# Patient Record
Sex: Male | Born: 1980 | Hispanic: Yes | Marital: Married | Smoking: Former smoker
Health system: Southern US, Community
[De-identification: ages and names within clinical notes are randomized; demographics above are authoritative.]

## PROBLEM LIST (undated history)

## (undated) DIAGNOSIS — Z972 Presence of dental prosthetic device (complete) (partial): Secondary | ICD-10-CM

## (undated) HISTORY — PX: MULTIPLE TOOTH EXTRACTIONS: SHX2053

---

## 2014-08-18 ENCOUNTER — Emergency Department (HOSPITAL_COMMUNITY)
Admission: EM | Admit: 2014-08-18 | Discharge: 2014-08-18 | Disposition: A | Discharge status: Home / Self Care | Payer: Self-pay | Attending: Emergency Medicine | Admitting: Emergency Medicine

## 2014-08-18 ENCOUNTER — Encounter (HOSPITAL_COMMUNITY): Payer: Self-pay | Admitting: Emergency Medicine

## 2014-08-18 DIAGNOSIS — L259 Unspecified contact dermatitis, unspecified cause: Secondary | ICD-10-CM | POA: Insufficient documentation

## 2014-08-18 MED ORDER — PREDNISONE 20 MG PO TABS
60.0000 mg | ORAL_TABLET | Freq: Once | ORAL | Status: AC
  Administered 2014-08-18: 60 mg via ORAL
  Filled 2014-08-18: qty 3

## 2014-08-18 MED ORDER — DIPHENHYDRAMINE HCL 25 MG PO TABS: 25.0000 mg | ORAL_TABLET | Freq: Four times a day (QID) | ORAL | Status: DC | PRN

## 2014-08-18 MED ORDER — PREDNISONE 50 MG PO TABS
ORAL_TABLET | ORAL | Status: DC
Start: 2014-08-18 — End: 2022-08-04

## 2014-08-18 MED ORDER — DIPHENHYDRAMINE HCL 25 MG PO CAPS
25.0000 mg | ORAL_CAPSULE | Freq: Once | ORAL | Status: AC
  Administered 2014-08-18: 25 mg via ORAL
  Filled 2014-08-18: qty 1

## 2014-08-18 NOTE — ED Provider Notes (Signed)
CSN: [id number]     Arrival date & time 08/18/14  1240 History  This chart was scribed for Trixie DredgeEmily Jordan Caraveo, PA-C, working with Gwyneth SproutWhitney Plunkett, MD by Chestine SporeSoijett Blue, ED Scribe. The patient was seen in room TR09C/TR09C at 1:50 PM.     Chief Complaint  Patient presents with  . Rash    The history is provided by the patient. No language interpreter was used.   Donald [name] is a 33 y.o. male who presents today complaining of rash onset 2 weeks. He states that he has poison ivy. He states that he has been itching on both arms and his torso. He states that he was cleaning the house and came in contact with it. He states that he is having associated symptoms of itching. He states that he has tried Benadryl cream with no relief for his symptoms. He denies any other symptoms.  Denies any discharge from the rash.  Denies itching or swelling of the mouth or throat, difficulty swallowing or breathing.      History reviewed. No pertinent past medical history. History reviewed. No pertinent past surgical history. History reviewed. No pertinent family history. History  Substance Use Topics  . Smoking status: Not on file  . Smokeless tobacco: Not on file  . Alcohol Use: No    Review of Systems  Constitutional: Negative for fever.  HENT: Negative for sore throat and trouble swallowing.   Respiratory: Negative for shortness of breath.   Skin: Positive for rash (both arms and torso).  Allergic/Immunologic: Negative for immunocompromised state.  All other systems reviewed and are negative.     Allergies  Review of patient's allergies indicates no known allergies.  Home Medications   Prior to Admission medications   Not on File   BP 123/64  Pulse 55  Temp(Src) 97.4 F (36.3 C) (Oral)  Resp 16  Ht 5\' 8"  (1.727 m)  Wt 250 lb (113.399 kg)  BMI 38.02 kg/m2  SpO2 99%  Physical Exam  Nursing note and vitals reviewed. Constitutional: He appears well-developed and well-nourished. No distress.   HENT:  Head: Normocephalic and atraumatic.  Mouth/Throat: Oropharynx is clear and moist. No oropharyngeal exudate.  Neck: Neck supple.  Pulmonary/Chest: Effort normal.  Neurological: He is alert.  Skin: Rash noted. He is not diaphoretic.  Scattered papules diffusely and occasionally in linear arrangement over bilateral upper extremities, not in the intertriginous area. Not on the palms. Few lesions over the right ear and the left abdominal wall.    ED Course  Procedures (including critical care time) DIAGNOSTIC STUDIES: Oxygen Saturation is 99% on room air, normal by my interpretation.    COORDINATION OF CARE: 1:53 PM-Discussed treatment plan which includes Prednisone and Benadryl with pt at bedside and pt agreed to plan.   Labs Review Labs Reviewed - No data to display  Imaging Review No results found.   EKG Interpretation None      MDM   Final diagnoses:  Contact dermatitis    Afebrile, nontoxic patient with pruritic rash x 2 weeks after contact with poison ivy.  No airway concerns.   D/C home with prednisone, benadryl, PCP follow up PRN.  Discussed result, findings, treatment, and follow up  with patient.  Pt given return precautions.  Pt verbalizes understanding and agrees with plan.       I personally performed the services described in this documentation, which was scribed in my presence. The recorded information has been reviewed and is accurate.    Irving BurtonEmily  GilbertWest, PA-C 08/18/14 1413

## 2014-08-18 NOTE — ED Notes (Signed)
Pt reports having poison ivy. Has rash and itching to both arms and torso x 2 weeks.

## 2014-08-18 NOTE — ED Provider Notes (Signed)
Medical screening examination/treatment/procedure(s) were performed by non-physician practitioner and as supervising physician I was immediately available for consultation/collaboration.   EKG Interpretation None        Armand Preast, MD 08/18/14 1604 

## 2014-08-18 NOTE — Discharge Instructions (Signed)
Read the information below.  Use the prescribed medication as directed.  Please discuss all new medications with your pharmacist.  You may return to the Emergency Department at any time for worsening condition or any new symptoms that concern you.  If you develop redness, swelling, pus draining from the wound, or fevers greater than 100.4, return to the ER immediately for a recheck.      Contact Dermatitis Contact dermatitis is a reaction to certain substances that touch the skin. Contact dermatitis can be either irritant contact dermatitis or allergic contact dermatitis. Irritant contact dermatitis does not require previous exposure to the substance for a reaction to occur.Allergic contact dermatitis only occurs if you have been exposed to the substance before. Upon a repeat exposure, your body reacts to the substance.  CAUSES  Many substances can cause contact dermatitis. Irritant dermatitis is most commonly caused by repeated exposure to mildly irritating substances, such as:  Makeup.  Soaps.  Detergents.  Bleaches.  Acids.  Metal salts, such as nickel. Allergic contact dermatitis is most commonly caused by exposure to:  Poisonous plants.  Chemicals (deodorants, shampoos).  Jewelry.  Latex.  Neomycin in triple antibiotic cream.  Preservatives in products, including clothing. SYMPTOMS  The area of skin that is exposed may develop:  Dryness or flaking.  Redness.  Cracks.  Itching.  Pain or a burning sensation.  Blisters. With allergic contact dermatitis, there may also be swelling in areas such as the eyelids, mouth, or genitals.  DIAGNOSIS  Your caregiver can usually tell what the problem is by doing a physical exam. In cases where the cause is uncertain and an allergic contact dermatitis is suspected, a patch skin test may be performed to help determine the cause of your dermatitis. TREATMENT Treatment includes protecting the skin from further contact with the  irritating substance by avoiding that substance if possible. Barrier creams, powders, and gloves may be helpful. Your caregiver may also recommend:  Steroid creams or ointments applied 2 times daily. For best results, soak the rash area in cool water for 20 minutes. Then apply the medicine. Cover the area with a plastic wrap. You can store the steroid cream in the refrigerator for a "chilly" effect on your rash. That may decrease itching. Oral steroid medicines may be needed in more severe cases.  Antibiotics or antibacterial ointments if a skin infection is present.  Antihistamine lotion or an antihistamine taken by mouth to ease itching.  Lubricants to keep moisture in your skin.  Burow's solution to reduce redness and soreness or to dry a weeping rash. Mix one packet or tablet of solution in 2 cups cool water. Dip a clean washcloth in the mixture, wring it out a bit, and put it on the affected area. Leave the cloth in place for 30 minutes. Do this as often as possible throughout the day.  Taking several cornstarch or baking soda baths daily if the area is too large to cover with a washcloth. Harsh chemicals, such as alkalis or acids, can cause skin damage that is like a burn. You should flush your skin for 15 to 20 minutes with cold water after such an exposure. You should also seek immediate medical care after exposure. Bandages (dressings), antibiotics, and pain medicine may be needed for severely irritated skin.  HOME CARE INSTRUCTIONS  Avoid the substance that caused your reaction.  Keep the area of skin that is affected away from hot water, soap, sunlight, chemicals, acidic substances, or anything else that would  irritate your skin.  Do not scratch the rash. Scratching may cause the rash to become infected.  You may take cool baths to help stop the itching.  Only take over-the-counter or prescription medicines as directed by your caregiver.  See your caregiver for follow-up care as  directed to make sure your skin is healing properly. SEEK MEDICAL CARE IF:   Your condition is not better after 3 days of treatment.  You seem to be getting worse.  You see signs of infection such as swelling, tenderness, redness, soreness, or warmth in the affected area.  You have any problems related to your medicines. Document Released: 10/15/2000 Document Revised: 01/10/2012 Document Reviewed: 03/23/2011 [facility] Patient Information 2015 [place], [place]. This information is not intended to replace advice given to you by your health care provider. Make sure you discuss any questions you have with your health care provider.

## 2014-08-18 NOTE — ED Notes (Addendum)
Got poison ivy on arms  after cleaning around house 2 weeks ago itching bad some spots dry has been using cream has not helped and has benadryl al;s

## 2019-11-13 ENCOUNTER — Encounter (HOSPITAL_COMMUNITY): Payer: Self-pay | Admitting: Emergency Medicine

## 2019-11-13 ENCOUNTER — Emergency Department (HOSPITAL_COMMUNITY): Payer: Self-pay

## 2019-11-13 ENCOUNTER — Other Ambulatory Visit: Payer: Self-pay

## 2019-11-13 ENCOUNTER — Emergency Department (HOSPITAL_COMMUNITY)
Admission: EM | Admit: 2019-11-13 | Discharge: 2019-11-13 | Disposition: A | Discharge status: Home / Self Care | Payer: Self-pay | Attending: Emergency Medicine | Admitting: Emergency Medicine

## 2019-11-13 DIAGNOSIS — M79604 Pain in right leg: Secondary | ICD-10-CM | POA: Insufficient documentation

## 2019-11-13 DIAGNOSIS — W19XXXA Unspecified fall, initial encounter: Secondary | ICD-10-CM

## 2019-11-13 DIAGNOSIS — M79662 Pain in left lower leg: Secondary | ICD-10-CM | POA: Insufficient documentation

## 2019-11-13 DIAGNOSIS — W1789XA Other fall from one level to another, initial encounter: Secondary | ICD-10-CM | POA: Insufficient documentation

## 2019-11-13 MED ORDER — IBUPROFEN 800 MG PO TABS: 800.0000 mg | ORAL_TABLET | Freq: Three times a day (TID) | ORAL | 0 refills | Status: DC

## 2019-11-13 MED ORDER — CYCLOBENZAPRINE HCL 10 MG PO TABS: 10.0000 mg | ORAL_TABLET | Freq: Every day | ORAL | 0 refills | Status: DC

## 2019-11-13 NOTE — Discharge Instructions (Addendum)
Descanse, aplique hielo en la pierna y levntela con regularidad para disminuir la hinchazn.  Tome su ibuprofeno, segn lo prescrito. Le recetaron Flexeril, que es un relajante muscular. No debe conducir, trabajar, consumir alcohol u operar maquinaria mientras toma [place], ya que puede causarle mucho sueo.   Soportar peso y Radio producer ejercicio segn lo tolere.  Llame al mdico ortopdico, Dr. Linna Caprice, para programar una cita para la evaluacin y el tratamiento continuos lo antes posible.  Regrese al servicio de urgencias o busque atencin mdica de inmediato si presenta una disminucin del rango de movimiento, empeoramiento del entumecimiento, pie fro y falta de pulso, incapacidad para flexionar o extender las articulaciones o cualquier otro sntoma nuevo o que empeore.

## 2019-11-13 NOTE — ED Provider Notes (Signed)
[facility] EMERGENC[facility] DEPARTMENT Provider Note   CSN: [id number] Arrival date & time: 11/13/19  1506     History No chief complaint on file.   Donald [name] is a 39 y.o. male with PMH significant for lower left leg surgery with IM nail placement performed approximately 10 years ago subsequent to [facility] who presents the ED with 8 out of 10 lower left leg pain.  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  He reports that since then he has had significant difficulty ambulating due to his pain discomfort.  He reports having diminished sensation and ROM and his lower left leg subsequent to his injury approximately 10 years ago, but states that his inability to ambulate is a new problem.  He denies any other injury.  His left lower leg discomfort is mainly in the area of his calf.    HPI     History reviewed. No pertinent past medical history.  There are no problems to display for this patient.   History reviewed. No pertinent surgical history.     No family history on file.  Social History   Tobacco Use  . Smoking status: Not on file  Substance Use Topics  . Alcohol use: No  . Drug use: No    Home Medications Prior to Admission medications   Medication Sig Start Date End Date Taking? Authorizing Provider  diphenhydrAMINE (BENADRYL) 25 MG tablet Take 1 tablet (25 mg total) by mouth every 6 (six) hours as needed for itching (or rash). 08/18/14   Clayton Bibles, PA-C  predniSONE (DELTASONE) 50 MG tablet Take 1 tablet PO daily x 7 days.  Start 08/19/14 08/18/14   Clayton Bibles, PA-C    Allergies    Patient has no known allergies.  Review of Systems   Review of Systems  Constitutional: Negative for fever.  Musculoskeletal: Positive for arthralgias and gait problem.  Skin: Negative for wound.    Physical Exam Updated Vital  Signs BP 107/68 (BP Location: Left Arm)   Pulse 85   Temp 98.8 F (37.1 C) (Oral)   Resp 16   SpO2 94%   Physical Exam Vitals and nursing note reviewed. Exam conducted with a chaperone present.  Constitutional:      Appearance: Normal appearance.  HENT:     Head: Normocephalic and atraumatic.  Eyes:     General: No scleral icterus.    Conjunctiva/sclera: Conjunctivae normal.  Pulmonary:     Effort: Pulmonary effort is normal.  Musculoskeletal:     Comments: Significant deformity of left lower leg due to history of surgery with IM nail placement.  No new deformity.  Sensation intact distally, albeit limited.  Pulses intact.  Warm extremity.  No significant swelling or erythema.  TTP in area of left calf, none over anterior tibiotalar region.  Negative Thompson's test.  Achilles tendon nontender to palpation.  Left knee demonstrates full ROM and strength intact.  Left ankle demonstrates diminished ability to dorsiflex.   Skin:    General: Skin is dry.     Comments: No open wounds.  No overlying erythema or other skin changes.  Neurological:     Mental Status: He is alert.     GCS: GCS eye subscore is 4. GCS verbal subscore is 5. GCS motor subscore is 6.  Psychiatric:  Mood and Affect: Mood normal.        Behavior: Behavior normal.        Thought Content: Thought content normal.     ED Results / Procedures / Treatments   Labs (all labs ordered are listed, but only abnormal results are displayed) Labs Reviewed - No data to display  EKG None  Radiology DG Tibia/Fibula Left  Result Date: 11/13/2019 CLINICAL DATA:  Larey Seat through flooring in a house under construction EXAM: LEFT TIBIA AND FIBULA - 2 VIEW COMPARISON:  None. FINDINGS: Tibial intramedullary nail is noted secured proximally and distally with threaded fixation screws. Rod traverses a remote healed distal diaphyseal fracture deformity of the tibia. Additional healed mid diaphyseal deformity of the fibula is  present. Metallic staples noted along the medial aspect of the lower leg. No acute fracture or traumatic malalignment is evident. Degenerative changes at the knee and ankle. IMPRESSION: 1. No acute fracture or traumatic malalignment. 2. Healed tibial and fibular fractures with prior tibial intramedullary nail placement. No acute hardware complication. 3. Metallic staples along the medial aspect of the lower leg. Electronically Signed   By: Kreg Shropshire M.D.   On: 11/13/2019 16:26   DG Tibia/Fibula Right  Result Date: 11/13/2019 CLINICAL DATA:  Larey Seat through flooring in a house under construction EXAM: RIGHT TIBIA AND FIBULA - 2 VIEW COMPARISON:  None. FINDINGS: Degenerative changes at the knee and ankle with periarticular spurring. Question some irregular lucency through anterior spurs of the distal tibia but without significant adjacent soft tissue swelling. No other sites concerning for fracture. No traumatic malalignment. Soft tissue gas or foreign body. Mild plantar calcaneal spur is noted IMPRESSION: Irregular lucency through anterior spurs of the distal tibia but without significant adjacent soft tissue swelling, possibly degenerative though could reflect a fragmented osteophyte. Correlate for point tenderness along the anterior tibiotalar joint line. No other sites concerning for acute osseous injury Electronically Signed   By: Kreg Shropshire M.D.   On: 11/13/2019 16:29   DG Knee Complete 4 Views Left  Result Date: 11/13/2019 CLINICAL DATA:  Larey Seat through flooring in a house under construction EXAM: LEFT KNEE - COMPLETE 4+ VIEW COMPARISON:  None FINDINGS: Intramedullary rods of the femur and tibia are present without acute complication. No acute bony abnormality. Specifically, no fracture, subluxation, or dislocation. Tricompartmental degenerative changes of the knee. No visible effusion or significant soft tissue swelling. IMPRESSION: 1. No acute osseous abnormality. 2. Intramedullary rods of the femur  and tibia without acute complication. Electronically Signed   By: Kreg Shropshire M.D.   On: 11/13/2019 16:23   DG Knee Complete 4 Views Right  Result Date: 11/13/2019 CLINICAL DATA:  Larey Seat through flooring in a house under construction EXAM: RIGHT KNEE - COMPLETE 4+ VIEW COMPARISON:  None. FINDINGS: No acute bony abnormality. Specifically, no fracture, subluxation, or dislocation. No sizable effusion. Mild soft tissue swelling anteriorly. IMPRESSION: Mild soft tissue swelling anteriorly. No acute osseous abnormality. Electronically Signed   By: Kreg Shropshire M.D.   On: 11/13/2019 16:24    Procedures Procedures (including critical care time)  Medications Ordered in ED Medications - No data to display  ED Course  I have reviewed the triage vital signs and the nursing notes.  Pertinent labs & imaging results that were available during my care of the patient were reviewed by me and considered in my medical decision making (see chart for details).    MDM Rules/Calculators/A&P  Patient sustained a fall while at work today.  He was approximately 4 feet and he reports that he landed all of his weight onto his left foot.  He did not fall to the ground and he denies any head injury or other bodily injury.  He reports antalgic gait and pain with weightbearing, but denies any new deformity, diminished range of motion, new numbness, or other new neurologic findings.  Suspect possible injury to gastrocnemius muscle.  Plain films obtained of left leg demonstrate no traumatic malalignment or areas concerning for an acute fracture or dislocation.  Possible fragmented osteophyte and tibiotalar region, but no TTP in that area.  Patient was able to demonstrate ability to stand, but weightbearing on left leg was limited due to his discomfort.  His tenderness to palpation was not excruciating and is difficult to assess swelling given his lower leg deformity secondary to surgery.  While he states  that it is more swollen than usual, he denies any new deformities.  Low suspicion for compartment syndrome or an Achilles tendon rupture.  Given lack of new ROM limitations, new numbness or weakness, and relatively reassuring plain films, will refer patient to orthopedics for ongoing evaluation management.  Will prescribe ibuprofen 800 mg 3 times daily as well as a short course of Flexeril to take at night for his calf discomfort.  Encouraged him to ice, as needed, as well as elevate the leg.  He is in no acute distress and is safe for discharge at this time.  He voiced understanding and is agreeable to the plan. All of the evaluation and work-up results were discussed with the patient and any family at bedside. They were provided opportunity to ask any additional questions and have none at this time. They have expressed understanding of verbal discharge instructions as well as return precautions and are agreeable to the plan.   Translator was used for history, assessment, and plan.   Final Clinical Impression(s) / ED Diagnoses Final diagnoses:  Fall, initial encounter    Rx / DC Orders ED Discharge Orders    None       Elvera Maria 11/13/19 1804    Tegeler, Canary Brim, MD 11/13/19 [phone]

## 2019-11-13 NOTE — ED Triage Notes (Signed)
Pt was working on a house when he walked across some flooring and a piece of wood moved, pt fell through the crack injuring bilateral legs, denies hitting head or other injuries, skin abrasions and mild swelling noted, reports bilateral shin pain.

## 2020-09-14 IMAGING — DX DG TIBIA/FIBULA 2V*L*
5 series · 5 of 5 positions shown · non-contrast
Comparison: None.

CLINICAL DATA: Fell through flooring in a house under construction

EXAM:
LEFT TIBIA AND FIBULA - 2 VIEW

[tibia ap (1 of 2)]
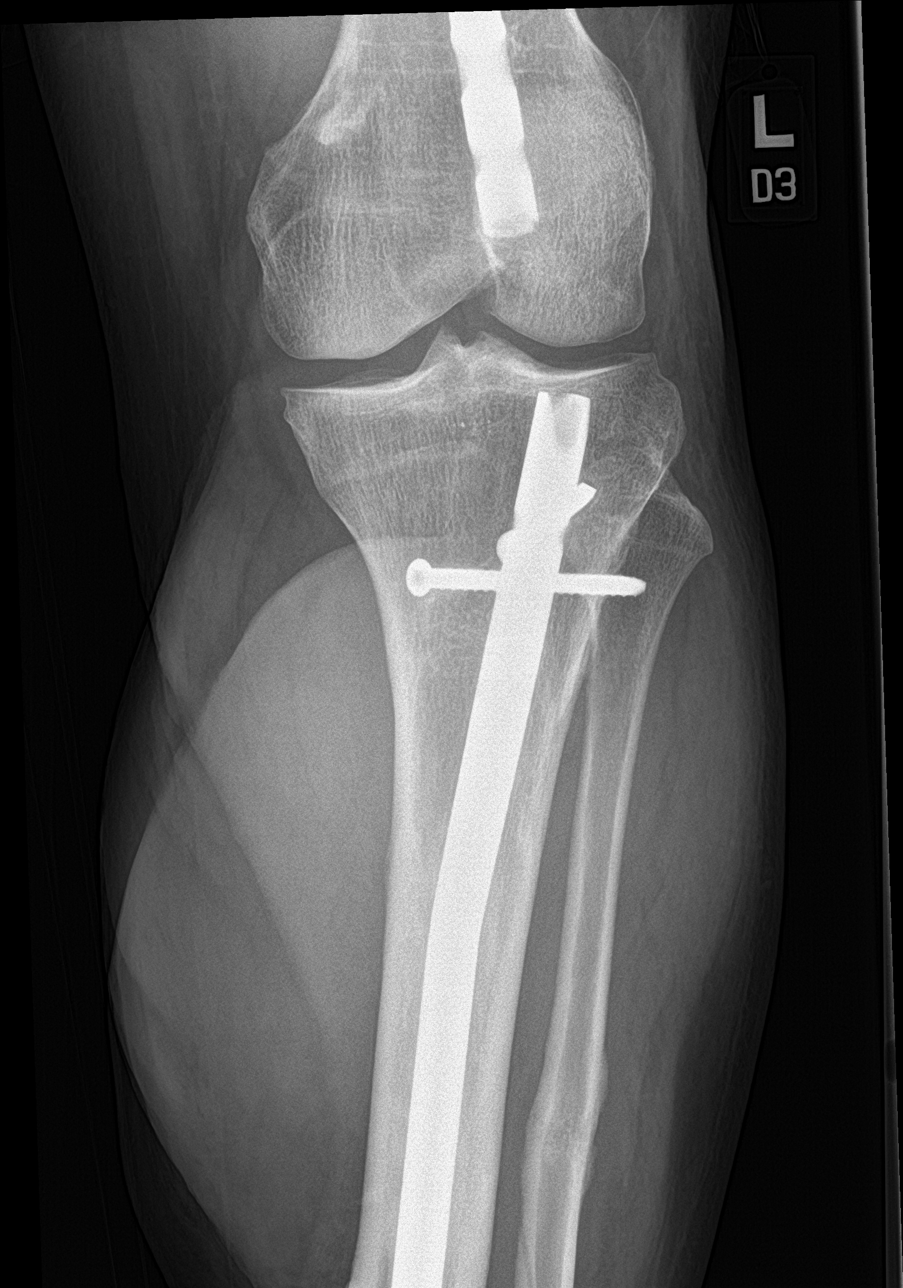

[tibia ap (2 of 2)]
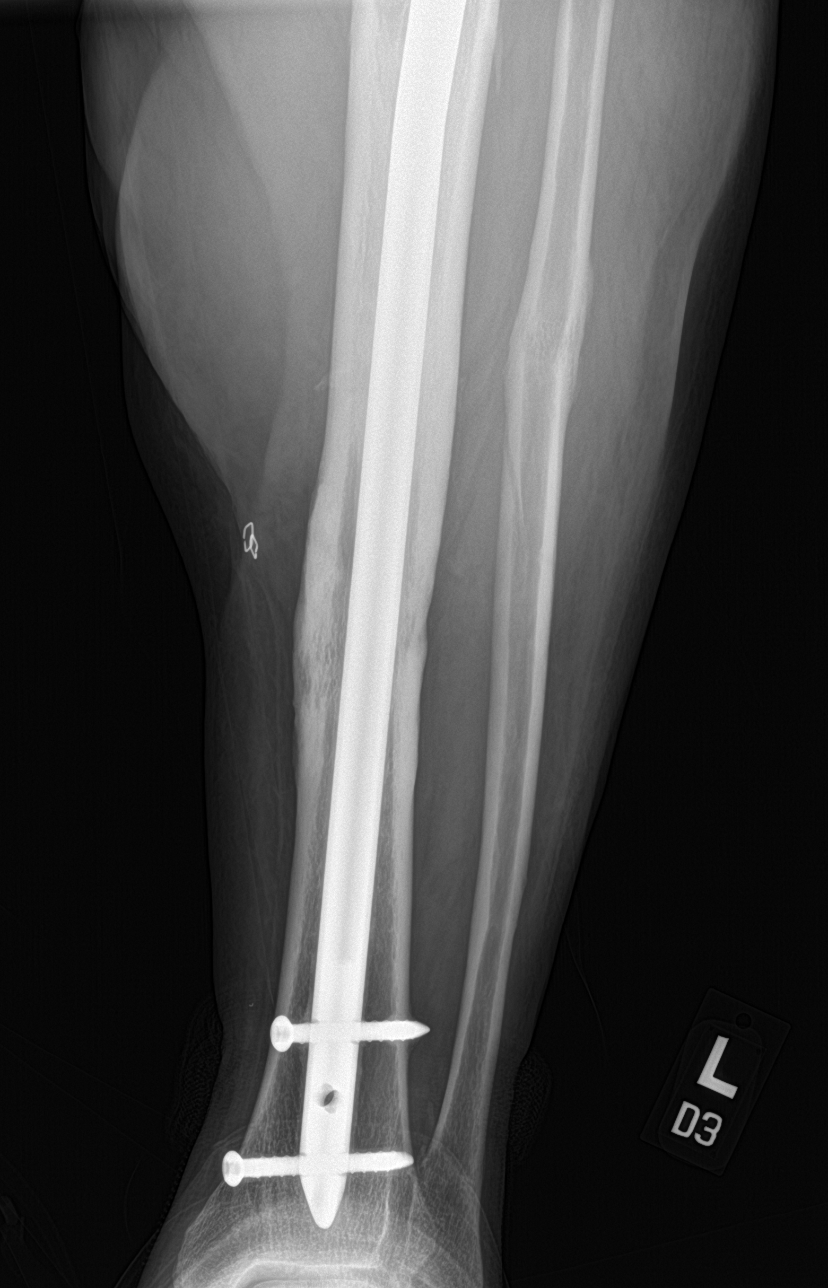

[tibia lat (1 of 3)]
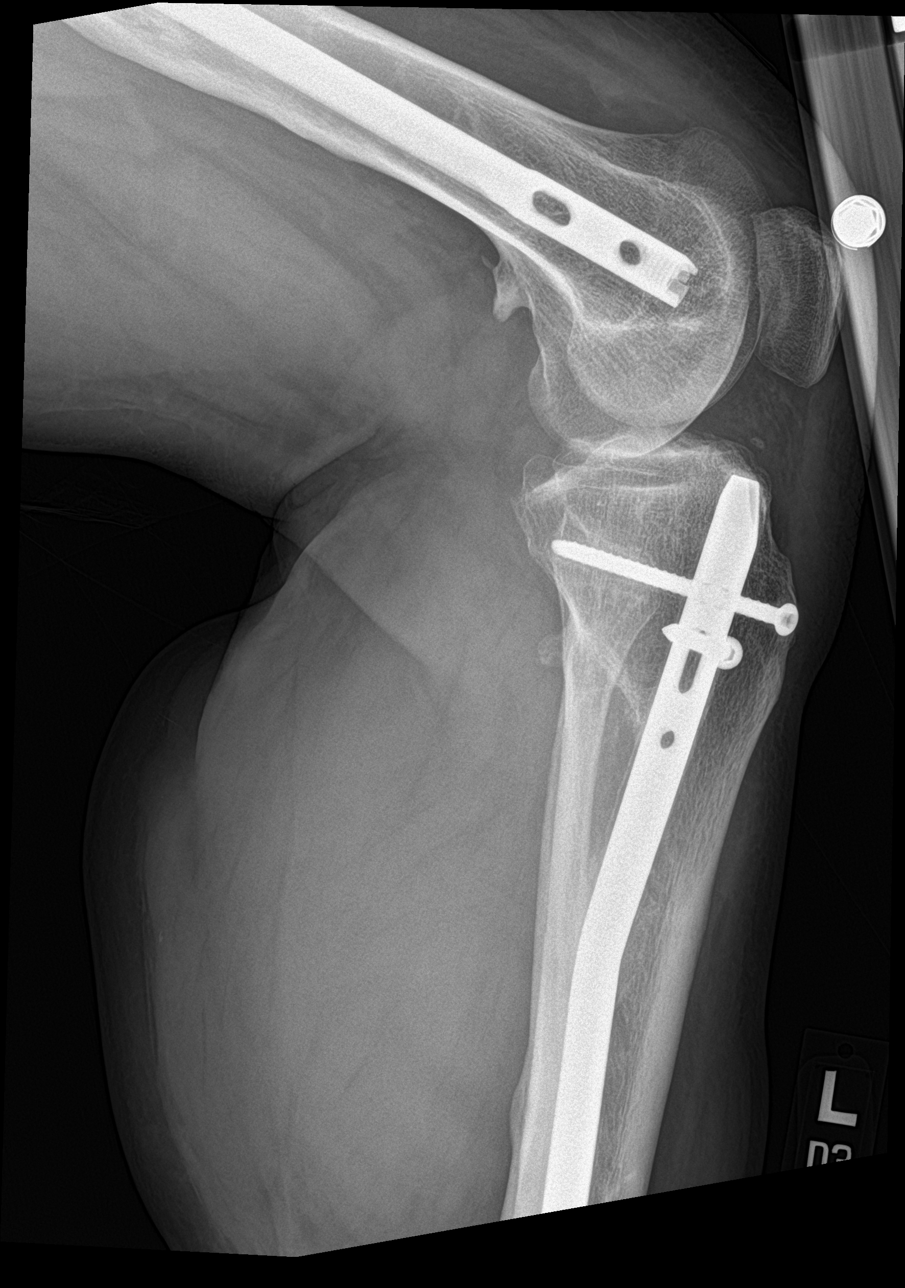

[tibia lat (2 of 3)]
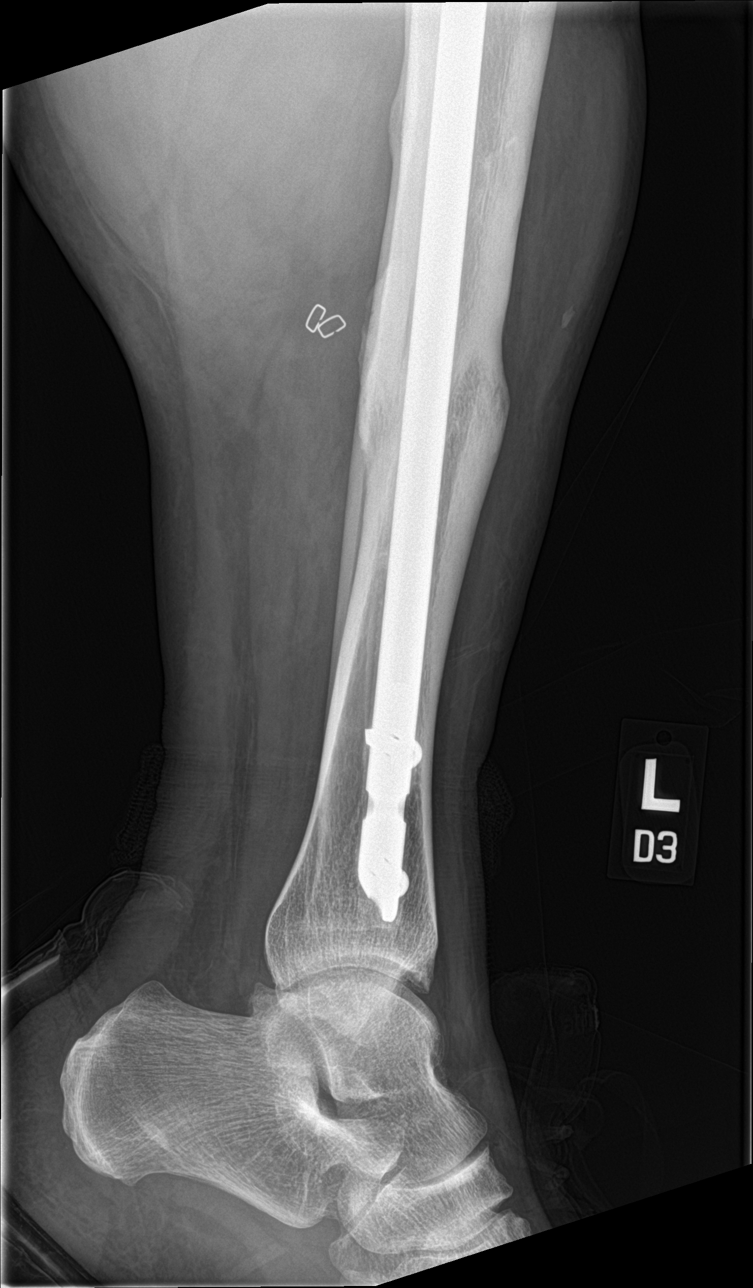

[tibia lat (3 of 3)]
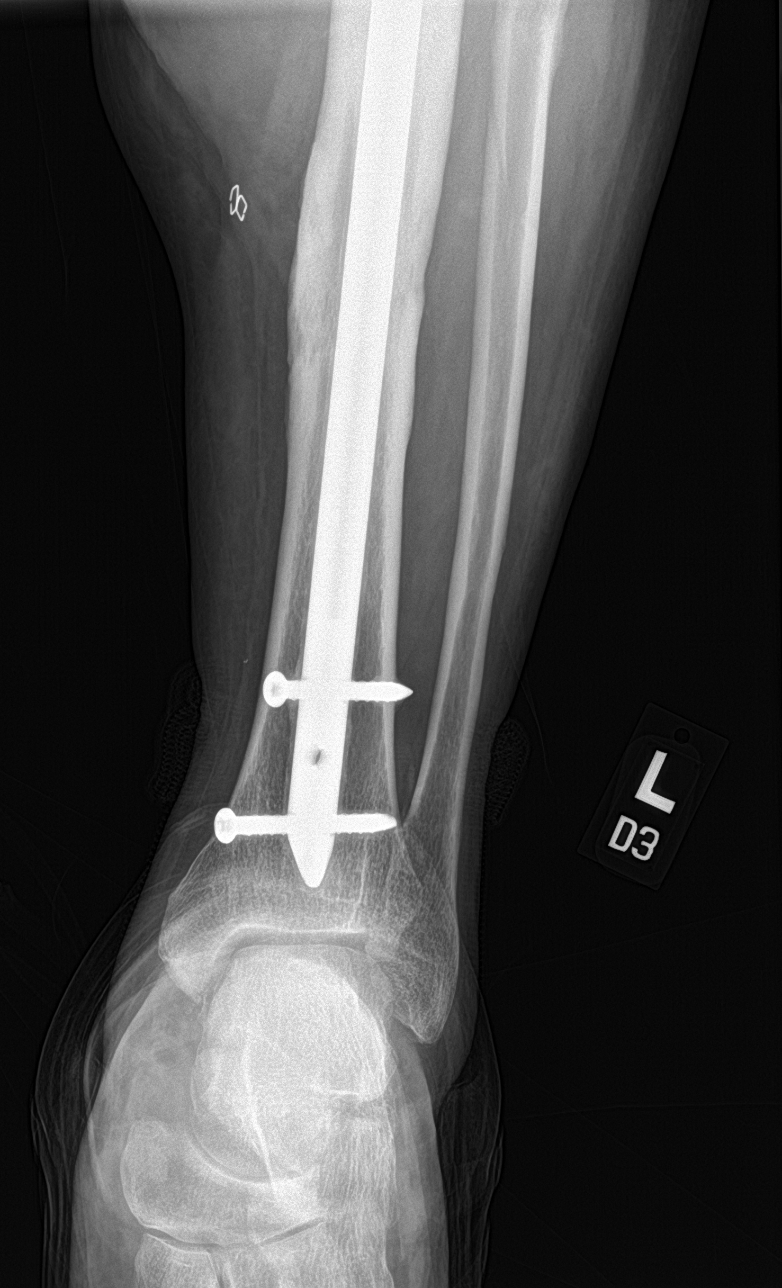

[5 of 5 positions shown; findings below may reference images not displayed]

FINDINGS: Tibial intramedullary nail is noted secured proximally and distally
with threaded fixation screws. Rod traverses a remote healed distal
diaphyseal fracture deformity of the tibia. Additional healed mid
diaphyseal deformity of the fibula is present. Metallic staples
noted along the medial aspect of the lower leg. No acute fracture or
traumatic malalignment is evident. Degenerative changes at the knee
and ankle.
IMPRESSION: 1. No acute fracture or traumatic malalignment.
2. Healed tibial and fibular fractures with prior tibial
intramedullary nail placement. No acute hardware complication.
3. Metallic staples along the medial aspect of the lower leg.

## 2020-12-10 ENCOUNTER — Encounter (HOSPITAL_COMMUNITY): Payer: Self-pay | Admitting: *Deleted

## 2020-12-10 ENCOUNTER — Ambulatory Visit (HOSPITAL_COMMUNITY)
Admission: EM | Admit: 2020-12-10 | Discharge: 2020-12-10 | Disposition: A | Discharge status: Home / Self Care | Payer: Self-pay | Attending: Family Medicine | Admitting: Family Medicine

## 2020-12-10 ENCOUNTER — Other Ambulatory Visit: Payer: Self-pay

## 2020-12-10 DIAGNOSIS — N390 Urinary tract infection, site not specified: Secondary | ICD-10-CM | POA: Insufficient documentation

## 2020-12-10 LAB — POCT URINALYSIS DIPSTICK, ED / UC
Bilirubin Urine: NEGATIVE
Glucose, UA: NEGATIVE mg/dL
Ketones, ur: NEGATIVE mg/dL
Nitrite: NEGATIVE
Protein, ur: 30 mg/dL — AB
Specific Gravity, Urine: 1.01 (ref 1.005–1.030)
Urobilinogen, UA: 0.2 mg/dL (ref 0.0–1.0)
pH: 6.5 (ref 5.0–8.0)

## 2020-12-10 MED ORDER — ACETAMINOPHEN 325 MG PO TABS
975.0000 mg | ORAL_TABLET | Freq: Once | ORAL | Status: AC
  Administered 2020-12-10: 975 mg via ORAL

## 2020-12-10 MED ORDER — ACETAMINOPHEN 325 MG PO TABS
ORAL_TABLET | ORAL | Status: AC
  Filled 2020-12-10: qty 3

## 2020-12-10 MED ORDER — CEPHALEXIN 500 MG PO CAPS: 500.0000 mg | ORAL_CAPSULE | Freq: Four times a day (QID) | ORAL | 0 refills | Status: DC

## 2020-12-10 MED ORDER — SULFAMETHOXAZOLE-TRIMETHOPRIM 800-160 MG PO TABS: 1.0000 | ORAL_TABLET | Freq: Two times a day (BID) | ORAL | 0 refills | Status: AC

## 2020-12-10 NOTE — ED Triage Notes (Signed)
Pt reports Sx's started last night with Frequency,dysuriaand lower back pain . Pt reports he has to pee evert 14 sec. Marland Kitchen

## 2020-12-10 NOTE — ED Provider Notes (Signed)
[facility]    CSN: [id number] Arrival date & time: 12/10/20  1621      History   Chief Complaint Chief Complaint  Patient presents with  . Dysuria  . Urinary Frequency  . Back Pain    HPI Donald [name] is a 40 y.o. male.   Griff Badley presents with complaints of urinary frequency, low back pain and chills which started mildly yesterday and worsened today. Noted blood to his urine earlier today. This has cleared up since increasing his water intake. No nausea or vomiting. No flank pain. Not necessarily painful with urination. No rectal pain or pelvic pain. Sexually active with his wife, denies any concerns for std's. No penile discharge. Denies any penile or scrotum redness, swelling sores or lesions. Denies any previous similar. He works in [employer], physical labor   ROS per HPI, negative if not otherwise mentioned.      History reviewed. No pertinent past medical history.  There are no problems to display for this patient.   History reviewed. No pertinent surgical history.     Home Medications    Prior to Admission medications   Medication Sig Start Date End Date Taking? Authorizing Provider  sulfamethoxazole-trimethoprim (BACTRIM DS) 800-160 MG tablet Take 1 tablet by mouth 2 (two) times daily for 7 days. 12/10/20 12/17/20 Yes Mechelle Pates, Barron Alvine, NP  cyclobenzaprine (FLEXERIL) 10 MG tablet Take 1 tablet (10 mg total) by mouth at bedtime. 11/13/19   Lorelee New, PA-C  diphenhydrAMINE (BENADRYL) 25 MG tablet Take 1 tablet (25 mg total) by mouth every 6 (six) hours as needed for itching (or rash). 08/18/14   Trixie Dredge, PA-C  ibuprofen (ADVIL) 800 MG tablet Take 1 tablet (800 mg total) by mouth 3 (three) times daily. 11/13/19   Lorelee New, PA-C  predniSONE (DELTASONE) 50 MG tablet Take 1 tablet PO daily x 7 days.  Start 08/19/14 08/18/14   Trixie Dredge, PA-C    Family History History reviewed. No pertinent family history.  Social History Social History    Tobacco Use  . Smoking status: Former Smoker    Quit date: 09/09/2020    Years since quitting: 0.2  . Smokeless tobacco: Never Used  Substance Use Topics  . Alcohol use: No  . Drug use: No     Allergies   Patient has no known allergies.   Review of Systems Review of Systems   Physical Exam Triage Vital Signs ED Triage Vitals  Enc Vitals Group     BP 12/10/20 1638 125/63     Pulse Rate 12/10/20 1638 (!) 110     Resp 12/10/20 1638 20     Temp 12/10/20 1638 (!) 101 F (38.3 C)     Temp Source 12/10/20 1638 Oral     SpO2 12/10/20 1638 98 %     Weight --      Height --      Head Circumference --      Peak Flow --      Pain Score 12/10/20 1641 10     Pain Loc --      Pain Edu? --      Excl. in GC? --    No data found.  Updated Vital Signs BP 125/63 (BP Location: Right Arm)   Pulse (!) 110   Temp (!) 101 F (38.3 C) (Oral)   Resp 20   SpO2 98%   Visual Acuity Right Eye Distance:   Left Eye Distance:   Bilateral Distance:  Right Eye Near:   Left Eye Near:    Bilateral Near:     Physical Exam Constitutional:      Appearance: He is well-developed.  Cardiovascular:     Rate and Rhythm: Tachycardia present.  Pulmonary:     Effort: Pulmonary effort is normal.  Abdominal:     Tenderness: There is no abdominal tenderness. There is no right CVA tenderness or left CVA tenderness.  Genitourinary:    Comments: Denies any genitalia redness, swelling, warmth, lesions  Skin:    General: Skin is warm and dry.  Neurological:     Mental Status: He is alert and oriented to person, place, and time.      UC Treatments / Results  Labs (all labs ordered are listed, but only abnormal results are displayed) Labs Reviewed  POCT URINALYSIS DIPSTICK, ED / UC - Abnormal; Notable for the following components:      Result Value   Hgb urine dipstick LARGE (*)    Protein, ur 30 (*)    Leukocytes,Ua MODERATE (*)    All other components within normal limits  URINE  CULTURE    EKG   Radiology No results found.  Procedures Procedures (including critical care time)  Medications Ordered in UC Medications  acetaminophen (TYLENOL) tablet 975 mg (975 mg Oral Given 12/10/20 1726)    Initial Impression / Assessment and Plan / UC Course  I have reviewed the triage vital signs and the nursing notes.  Pertinent labs & imaging results that were available during my care of the patient were reviewed by me and considered in my medical decision making (see chart for details).     Exquisite urinary frequency, no glucose to urine, febrile here with large hgb and leuks to urine. Bactrim initiated with culture pending. Encouraged follow up for recheck, prostatitis considered but seems low risk in this otherwise active 40 year old, low risk sti; no cva pain to indicate nephrolithiasis as source of hgb to urine. Return precautions provided. Patient verbalized understanding and agreeable to plan.   Final Clinical Impressions(s) / UC Diagnoses   Final diagnoses:  Lower urinary tract infectious disease     Discharge Instructions     Complete course of antibiotics.  It appears that you have a urinary tract infection.  Drink plenty of water to empty bladder regularly. Avoid alcohol and caffeine as these may irritate the bladder.   If no improvement or any worsening of symptoms, especially even by this weekend please return for recheck.  Please follow up with a primary care provider as it is unusual for otherwise healthy men to get urinary tract infections.   Curso completo de antibiticos. Parece que tienes una infeccin del tracto urinario. Beba mucha agua para vaciar la vejiga con regularidad. Evite el alcohol y la cafena, ya que pueden irritar la vejiga. Si los sntomas no mejoran o empeoran, especialmente incluso para este fin de semana, regrese para que lo revisen nuevamente. Haga un seguimiento con un proveedor de Marine scientist, ya que es inusual que  los hombres sanos tengan infecciones del tracto urinario.   ED Prescriptions    Medication Sig Dispense Auth. Provider   cephALEXin (KEFLEX) 500 MG capsule  (Status: Discontinued) Take 1 capsule (500 mg total) by mouth 4 (four) times daily for 7 days. 28 capsule Linus Mako B, NP   sulfamethoxazole-trimethoprim (BACTRIM DS) 800-160 MG tablet Take 1 tablet by mouth 2 (two) times daily for 7 days. 14 tablet Georgetta Haber, NP  PDMP not reviewed this encounter.   Georgetta Haber, NP 12/10/20 1736

## 2020-12-10 NOTE — Discharge Instructions (Addendum)
Complete course of antibiotics.  It appears that you have a urinary tract infection.  Drink plenty of water to empty bladder regularly. Avoid alcohol and caffeine as these may irritate the bladder.   If no improvement or any worsening of symptoms, especially even by this weekend please return for recheck.  Please follow up with a primary care provider as it is unusual for otherwise healthy men to get urinary tract infections.   Curso completo de antibiticos. Parece que tienes una infeccin del tracto urinario. Beba mucha agua para vaciar la vejiga con regularidad. Evite el alcohol y la cafena, ya que pueden irritar la vejiga. Si los sntomas no mejoran o empeoran, especialmente incluso para este fin de semana, regrese para que lo revisen nuevamente. Haga un seguimiento con un proveedor de Marine scientist, ya que es inusual que los hombres sanos tengan infecciones del tracto urinario.

## 2020-12-12 LAB — URINE CULTURE: Culture: >=100000 — AB

## 2020-12-13 LAB — URINE CULTURE

## 2020-12-15 ENCOUNTER — Telehealth (HOSPITAL_COMMUNITY): Payer: Self-pay | Admitting: Emergency Medicine

## 2020-12-15 MED ORDER — NITROFURANTOIN MONOHYD MACRO 100 MG PO CAPS
100.0000 mg | ORAL_CAPSULE | Freq: Two times a day (BID) | ORAL | 0 refills | Status: DC
Start: 2020-12-15 — End: 2022-08-04

## 2020-12-23 ENCOUNTER — Telehealth (HOSPITAL_COMMUNITY): Payer: Self-pay | Admitting: Emergency Medicine

## 2020-12-23 NOTE — Telephone Encounter (Signed)
Patient called after receiving results letter in the mail.  Reviewed urine culture, states he will go to pick up antibiotics.  Encouraged him to call the pharmacy in advance, and to let me know if there are any issues

## 2021-04-04 ENCOUNTER — Other Ambulatory Visit: Payer: Self-pay

## 2021-04-04 ENCOUNTER — Encounter (HOSPITAL_COMMUNITY): Payer: Self-pay

## 2021-04-04 ENCOUNTER — Emergency Department (HOSPITAL_COMMUNITY)
Admission: EM | Admit: 2021-04-04 | Discharge: 2021-04-04 | Disposition: A | Discharge status: Home / Self Care | Payer: Self-pay | Attending: Emergency Medicine | Admitting: Emergency Medicine

## 2021-04-04 ENCOUNTER — Emergency Department (HOSPITAL_COMMUNITY): Payer: Self-pay

## 2021-04-04 DIAGNOSIS — N451 Epididymitis: Secondary | ICD-10-CM | POA: Insufficient documentation

## 2021-04-04 DIAGNOSIS — Z87891 Personal history of nicotine dependence: Secondary | ICD-10-CM | POA: Insufficient documentation

## 2021-04-04 DIAGNOSIS — B9689 Other specified bacterial agents as the cause of diseases classified elsewhere: Secondary | ICD-10-CM | POA: Insufficient documentation

## 2021-04-04 LAB — CBC WITH DIFFERENTIAL/PLATELET
Abs Immature Granulocytes: 0.06 10*3/uL (ref 0.00–0.07)
Basophils Absolute: 0 10*3/uL (ref 0.0–0.1)
Basophils Relative: 0 %
Eosinophils Absolute: 0.1 10*3/uL (ref 0.0–0.5)
Eosinophils Relative: 0 %
HCT: 38.8 % — ABNORMAL LOW (ref 39.0–52.0)
Hemoglobin: 13.1 g/dL (ref 13.0–17.0)
Immature Granulocytes: 0 %
Lymphocytes Relative: 14 %
Lymphs Abs: 2 10*3/uL (ref 0.7–4.0)
MCH: 29.6 pg (ref 26.0–34.0)
MCHC: 33.8 g/dL (ref 30.0–36.0)
MCV: 87.6 fL (ref 80.0–100.0)
Monocytes Absolute: 1.3 10*3/uL — ABNORMAL HIGH (ref 0.1–1.0)
Monocytes Relative: 9 %
Neutro Abs: 10.7 10*3/uL — ABNORMAL HIGH (ref 1.7–7.7)
Neutrophils Relative %: 77 %
Platelets: 250 10*3/uL (ref 150–400)
RBC: 4.43 MIL/uL (ref 4.22–5.81)
RDW: 13.7 % (ref 11.5–15.5)
WBC: 14.2 10*3/uL — ABNORMAL HIGH (ref 4.0–10.5)
nRBC: 0 % (ref 0.0–0.2)

## 2021-04-04 LAB — URINALYSIS, ROUTINE W REFLEX MICROSCOPIC
Bacteria, UA: NONE SEEN
Bilirubin Urine: NEGATIVE
Glucose, UA: NEGATIVE mg/dL
Ketones, ur: NEGATIVE mg/dL
Nitrite: NEGATIVE
Protein, ur: NEGATIVE mg/dL
Specific Gravity, Urine: 1.013 (ref 1.005–1.030)
pH: 6 (ref 5.0–8.0)

## 2021-04-04 LAB — BASIC METABOLIC PANEL
Anion gap: 7 (ref 5–15)
BUN: 10 mg/dL (ref 6–20)
CO2: 26 mmol/L (ref 22–32)
Calcium: 8.7 mg/dL — ABNORMAL LOW (ref 8.9–10.3)
Chloride: 103 mmol/L (ref 98–111)
Creatinine, Ser: 0.74 mg/dL (ref 0.61–1.24)
GFR, Estimated: >60 mL/min (ref >60)
Glucose, Bld: 132 mg/dL — ABNORMAL HIGH (ref 70–99)
Potassium: 3.8 mmol/L (ref 3.5–5.1)
Sodium: 136 mmol/L (ref 135–145)

## 2021-04-04 MED ORDER — STERILE WATER FOR INJECTION IJ SOLN
INTRAMUSCULAR | Status: AC
  Administered 2021-04-04: 2.1 mL
  Filled 2021-04-04: qty 10

## 2021-04-04 MED ORDER — LEVOFLOXACIN 500 MG PO TABS: 500.0000 mg | ORAL_TABLET | Freq: Every day | ORAL | 0 refills | Status: DC

## 2021-04-04 MED ORDER — CEFTRIAXONE SODIUM 1 G IJ SOLR
1.0000 g | Freq: Once | INTRAMUSCULAR | Status: AC
  Administered 2021-04-04: 1 g via INTRAMUSCULAR
  Filled 2021-04-04: qty 10

## 2021-04-04 NOTE — ED Notes (Signed)
Reviewed discharge instructions with patient. Follow-up care and medications reviewed. Patient  verbalized understanding. Patient A&Ox4, VSS, and ambulatory with steady gait upon discharge.  °

## 2021-04-04 NOTE — ED Notes (Signed)
Patient transported to Ultrasound 

## 2021-04-04 NOTE — Discharge Instructions (Signed)
Take antibiotics as prescribed and complete the full course. Follow-up with urology, referral given.

## 2021-04-04 NOTE — ED Triage Notes (Signed)
Pt arrived to ED via POV w/ c/o hematuria and R testicle pain since Tuesday. Pt reports that drinking lots of water has helped lessen the blood in his urine. Pt has also been using ice for his testicular pain and reported that he got penicillin at "the [place] store" and has been taking that for infection. Pt rates pain 10/10 and appears in NAD. Pt denies medical hx other than a previous broken foot and denies any other medication use other than the penicillin.   Interpreter used for triage and assessment.

## 2021-04-04 NOTE — ED Provider Notes (Signed)
[facility] EMERGENCY DEPARTMENT Provider Note   CSN: [id number] Arrival date & time: 04/04/21  0734     History Chief Complaint  Patient presents with  . Hematuria    Donald [name] is a 40 y.o. male.  40 year old male with complaint of blood in the urine and right testicle pain x 4 days. Reports ongoing pain in the right testicle with swelling, hematuria resolved yesterday. Pain radiates to RLQ, denies fevers, nausea, vomiting, changes in bowel habits. Similar episode 2 months ago, diagnosed with UTI and treated with antibiotics. Patient has been taking penicillin for his symptoms today.         History reviewed. No pertinent past medical history.  There are no problems to display for this patient.   History reviewed. No pertinent surgical history.     History reviewed. No pertinent family history.  Social History   Tobacco Use  . Smoking status: Former Smoker    Quit date: 09/09/2020    Years since quitting: 0.5  . Smokeless tobacco: Never Used  Substance Use Topics  . Alcohol use: No  . Drug use: No    Home Medications Prior to Admission medications   Medication Sig Start Date End Date Taking? Authorizing Provider  levofloxacin (LEVAQUIN) 500 MG tablet Take 1 tablet (500 mg total) by mouth daily. 04/04/21  Yes Jeannie Fend, PA-C  cyclobenzaprine (FLEXERIL) 10 MG tablet Take 1 tablet (10 mg total) by mouth at bedtime. 11/13/19   Lorelee New, PA-C  diphenhydrAMINE (BENADRYL) 25 MG tablet Take 1 tablet (25 mg total) by mouth every 6 (six) hours as needed for itching (or rash). 08/18/14   Trixie Dredge, PA-C  ibuprofen (ADVIL) 800 MG tablet Take 1 tablet (800 mg total) by mouth 3 (three) times daily. 11/13/19   Lorelee New, PA-C  nitrofurantoin, macrocrystal-monohydrate, (MACROBID) 100 MG capsule Take 1 capsule (100 mg total) by mouth 2 (two) times daily. 12/15/20   Merrilee Jansky, MD  predniSONE (DELTASONE) 50 MG tablet Take 1 tablet PO  daily x 7 days.  Start 08/19/14 08/18/14   Trixie Dredge, PA-C    Allergies    Patient has no known allergies.  Review of Systems   Review of Systems  Constitutional: Negative for fever.  Gastrointestinal: Positive for abdominal pain. Negative for constipation, diarrhea, nausea and vomiting.  Genitourinary: Positive for dysuria, frequency, hematuria, scrotal swelling and testicular pain.  Musculoskeletal: Negative for back pain.  Skin: Negative for rash and wound.  Allergic/Immunologic: Negative for immunocompromised state.  Neurological: Negative for weakness.  Hematological: Negative for adenopathy.  Psychiatric/Behavioral: Negative for confusion.  All other systems reviewed and are negative.   Physical Exam Updated Vital Signs BP 128/67   Pulse 64   Temp 97.6 F (36.4 C) (Temporal)   Resp 18   Ht 5\' 8"  (1.727 m)   Wt 74.8 kg   SpO2 95%   BMI 25.09 kg/m   Physical Exam Vitals and nursing note reviewed.  Constitutional:      General: He is not in acute distress.    Appearance: He is well-developed. He is not diaphoretic.  HENT:     Head: Normocephalic and atraumatic.  Cardiovascular:     Rate and Rhythm: Normal rate and regular rhythm.     Heart sounds: Normal heart sounds.  Pulmonary:     Effort: Pulmonary effort is normal.     Breath sounds: Normal breath sounds.  Abdominal:     Palpations: Abdomen  is soft.     Tenderness: There is no abdominal tenderness. There is no right CVA tenderness or left CVA tenderness.  Genitourinary:    Testes:        Right: Tenderness and swelling present.        Left: Tenderness or swelling not present.  Skin:    General: Skin is warm and dry.     Findings: No erythema or rash.  Neurological:     Mental Status: He is alert and oriented to person, place, and time.  Psychiatric:        Behavior: Behavior normal.     ED Results / Procedures / Treatments   Labs (all labs ordered are listed, but only abnormal results are  displayed) Labs Reviewed  URINALYSIS, ROUTINE W REFLEX MICROSCOPIC - Abnormal; Notable for the following components:      Result Value   Hgb urine dipstick MODERATE (*)    Leukocytes,Ua MODERATE (*)    All other components within normal limits  BASIC METABOLIC PANEL - Abnormal; Notable for the following components:   Glucose, Bld 132 (*)    Calcium 8.7 (*)    All other components within normal limits  CBC WITH DIFFERENTIAL/PLATELET - Abnormal; Notable for the following components:   WBC 14.2 (*)    HCT 38.8 (*)    Neutro Abs 10.7 (*)    Monocytes Absolute 1.3 (*)    All other components within normal limits  URINE CULTURE  GC/CHLAMYDIA PROBE AMP ([place]) NOT AT [facility]    EKG None  Radiology US SCROTUM W/DOPPLER  Result Date: 04/04/2021 CLINICAL DATA:  Right scrotum pain EXAM: SCROTAL ULTRASOUND DOPPLER ULTRASOUND OF THE TESTICLES TECHNIQUE: Complete ultrasound examination of the testicles, epididymis, and other scrotal structures was performed. Color and spectral Doppler ultrasound were also utilized to evaluate blood flow to the testicles. COMPARISON:  None. FINDINGS: Right testicle Measurements: 4.5 x 3.0 x 3.3 cm. No mass or microlithiasis visualized. Left testicle Measurements: 4.6 x 2.7 x 2.8 cm. No mass or microlithiasis visualized. Right epididymis: The right epididymis is diffusely enlarged and edematous. The right epididymis is diffusely hyperemic. No well-defined extratesticular mass on the right. Left epididymis: Normal in size and appearance. No edema or hyperemia. Hydrocele: Small hydrocele on the right. Minimal hydrocele on the left. Varicocele:  None visualized. Pulsed Doppler interrogation of both testes demonstrates normal low resistance arterial and venous waveforms bilaterally. Scrotal wall thickening noted on the right. IMPRESSION: 1. Enlarged, edematous, and hyperemic right epididymis consistent with right-sided epididymitis. No right epididymal mass. 2.  Left  epididymis appears normal. 3. No intratesticular mass on either side. No orchitis or testicular torsion on either side. 4.  Small hydrocele on the right.  Minimal hydrocele on the left. 5. Scrotal wall thickening on the right is probably due to the adjacent epididymitis. Electronically Signed   By: Bretta Bang III M.D.   On: 04/04/2021 09:01    Procedures Procedures   Medications Ordered in ED Medications  cefTRIAXone (ROCEPHIN) injection 1 g (1 g Intramuscular Given 04/04/21 0925)  sterile water (preservative free) injection (2.1 mLs  Given 04/04/21 4008)    ED Course  I have reviewed the triage vital signs and the nursing notes.  Pertinent labs & imaging results that were available during my care of the patient were reviewed by me and considered in my medical decision making (see chart for details).  Clinical Course as of 04/04/21 1034  Sat Apr 04, 2021  10621 40 year old male  with right testicle pain and swelling with hematuria for the past 4 days.  Found to have moderate hemoglobin with moderate leukocytes without bacteria on UA.  Urine culture sent out as well as GC chlamydia although patient is in a monogamous relationship without concerns for STD at this time. Urine culture reviewed from February 2022 shows E. coli with resistance to Bactrim and additional antibiotics. Ultrasound negative for torsion, does show epididymitis on the right. Plan is to treat with IM Rocephin and discharged home on Levaquin. CBC with mild leukocytosis with white count of 14,000 with increased neutrophils, BMP with normal renal function. Patient is referred to urology for follow-up, given return to ER precautions. [LM]    Clinical Course User Index [LM] Alden Hipp   MDM Rules/Calculators/A&P                          Final Clinical Impression(s) / ED Diagnoses Final diagnoses:  Epididymitis, right    Rx / DC Orders ED Discharge Orders         Ordered    levofloxacin (LEVAQUIN) 500  MG tablet  Daily        04/04/21 0911           Jeannie Fend, PA-C 04/04/21 1034    Alvira Monday, MD 04/04/21 2251

## 2021-04-06 LAB — URINE CULTURE: Culture: 10000 — AB

## 2021-04-07 ENCOUNTER — Telehealth: Payer: Self-pay | Admitting: Emergency Medicine

## 2021-04-07 LAB — GC/CHLAMYDIA PROBE AMP (~~LOC~~) NOT AT ARMC
Chlamydia: NEGATIVE
Comment: NEGATIVE
Comment: NORMAL
Neisseria Gonorrhea: NEGATIVE

## 2021-04-07 NOTE — Telephone Encounter (Signed)
Post ED Visit - Positive Culture Follow-up  Culture report reviewed by antimicrobial stewardship pharmacist: [facility] []  , Pharm.D. []  Enzo Bi, Pharm.D., BCPS AQ-ID []  , Pharm.D., BCPS []  Celedonio Miyamoto, Pharm.D., BCPS []  Samson, Garvin Fila.D., BCPS, AAHIVP []  , Pharm.D., BCPS, AAHIVP []  Georgina Pillion, PharmD, BCPS []  , PharmD, BCPS []  [place], PharmD, BCPS []  [address], PharmD []  , PharmD, BCPS []  Estella Husk, PharmD  Pharmacy Team []  Lysle Pearl, PharmD []  , PharmD []  Phillips Climes, PharmD []  , Rph []  Agapito Games) , PharmD []  Verlan Friends, PharmD []  , PharmD []  Mervyn Gay, PharmD []  , PharmD []  Vinnie Level, PharmD []  [facility], PharmD []  , PharmD []  Len Childs, PharmD   Positive urine culture Treated with levofloxacin, organism sensitive to the same and no further patient follow-up is required at this time.  04/07/2021, 11:47 AM

## 2022-02-04 IMAGING — US US SCROTUM W/ DOPPLER COMPLETE
1 series · 13 of 25 positions shown · non-contrast
Comparison: None.

CLINICAL DATA: Right scrotum pain

EXAM:
SCROTAL ULTRASOUND
DOPPLER ULTRASOUND OF THE TESTICLES
TECHNIQUE: Complete ultrasound examination of the testicles, epididymis, and
other scrotal structures was performed. Color and spectral Doppler
ultrasound were also utilized to evaluate blood flow to the
testicles.

[Series 1: us scrotum w/doppler · 13 of 61 slices shown]
[im 1/61]
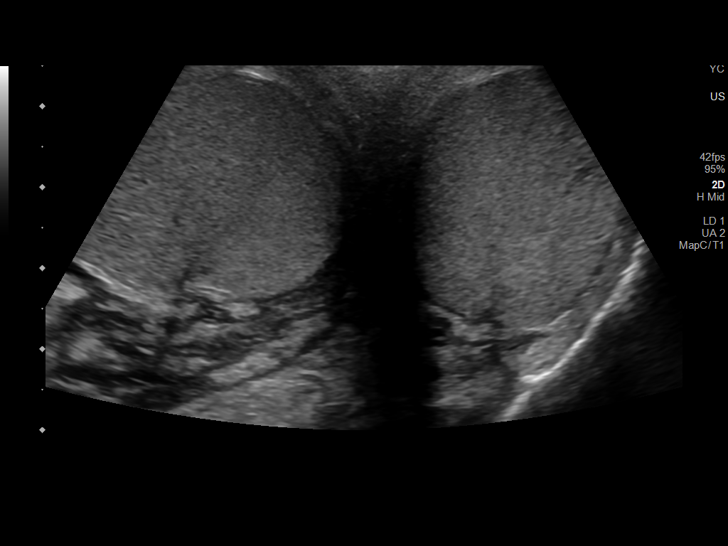
[im 6/61]
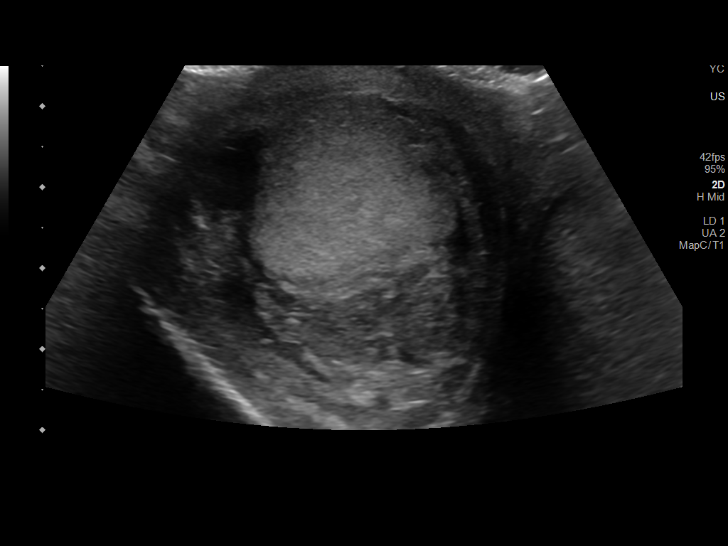
[im 11/61]
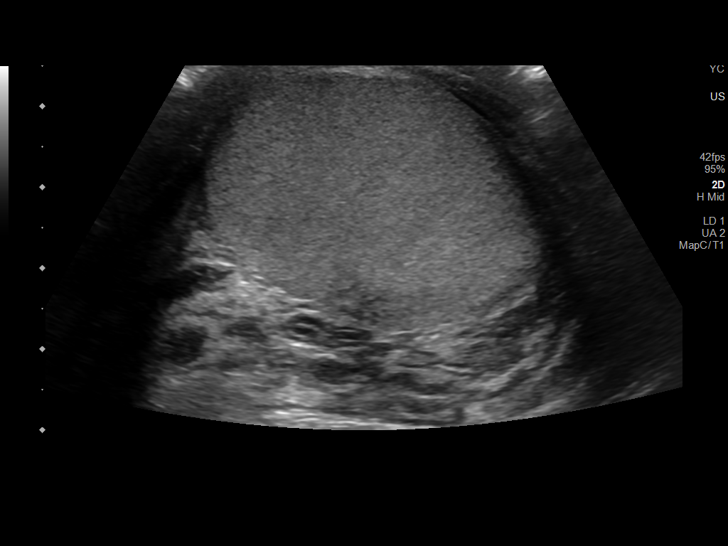
[im 16/61]
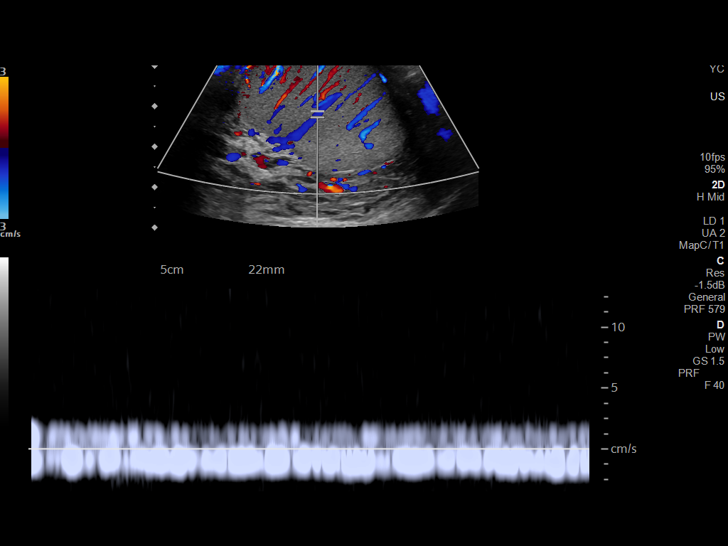
[im 21/61]
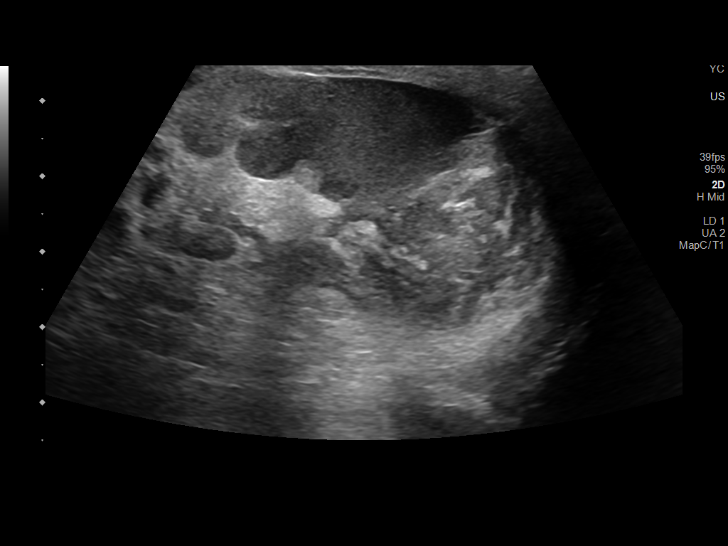
[im 26/61]
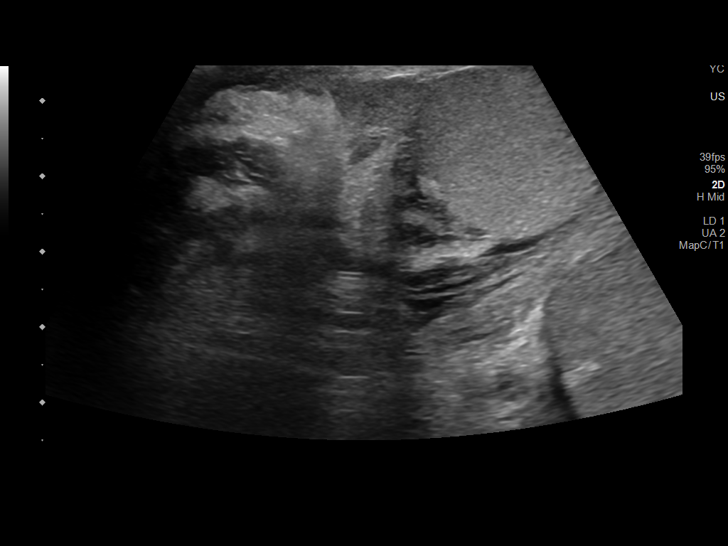
[im 31/61]
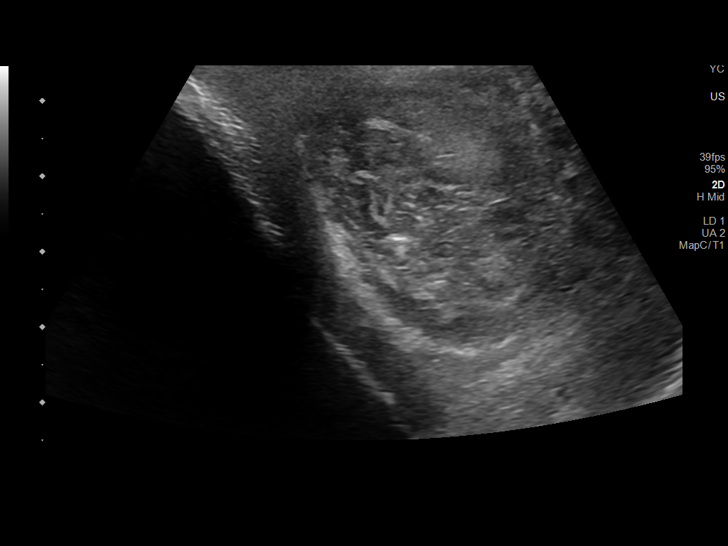
[im 36/61]
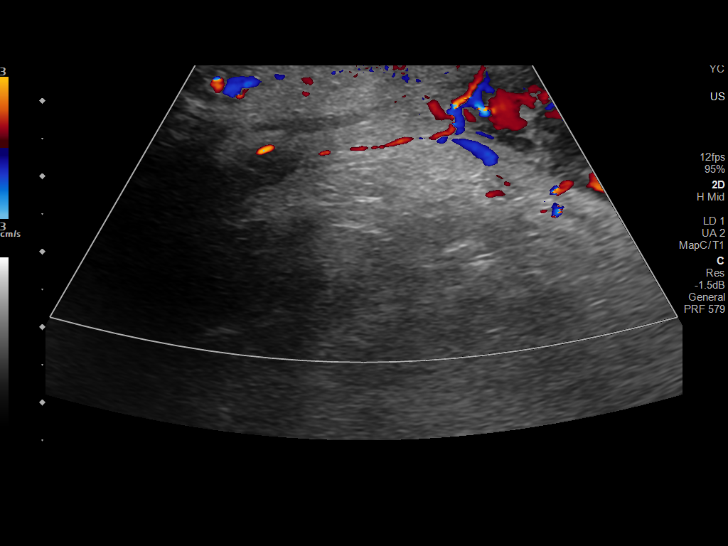
[im 41/61]
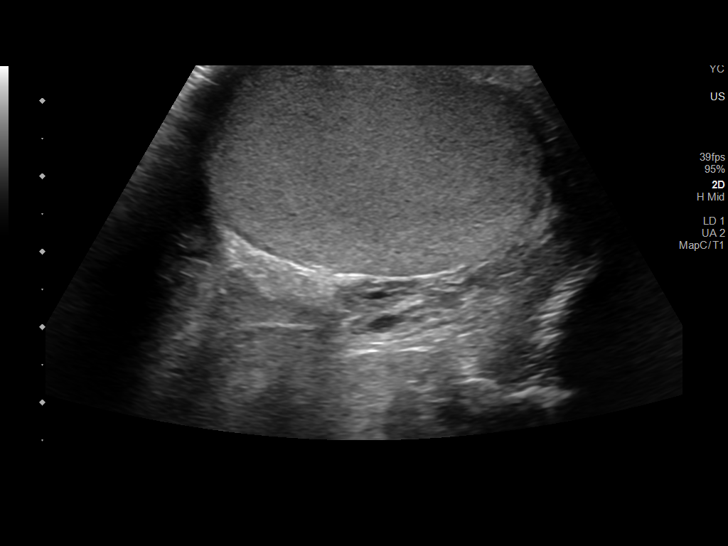
[im 46/61]
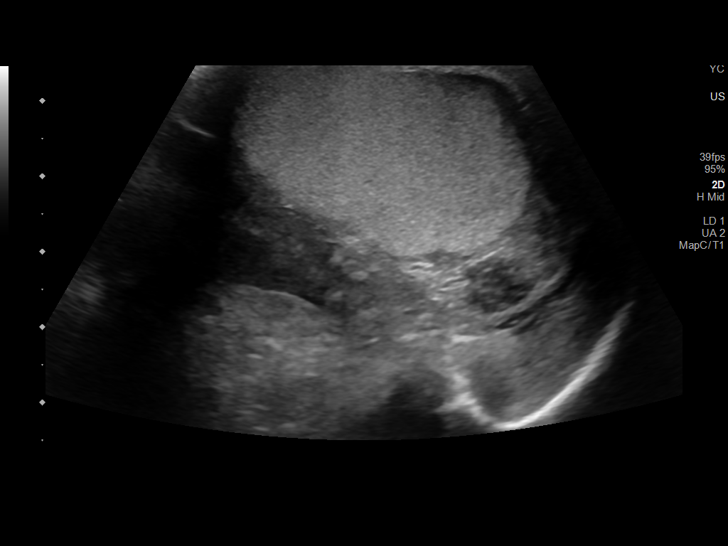
[im 51/61]
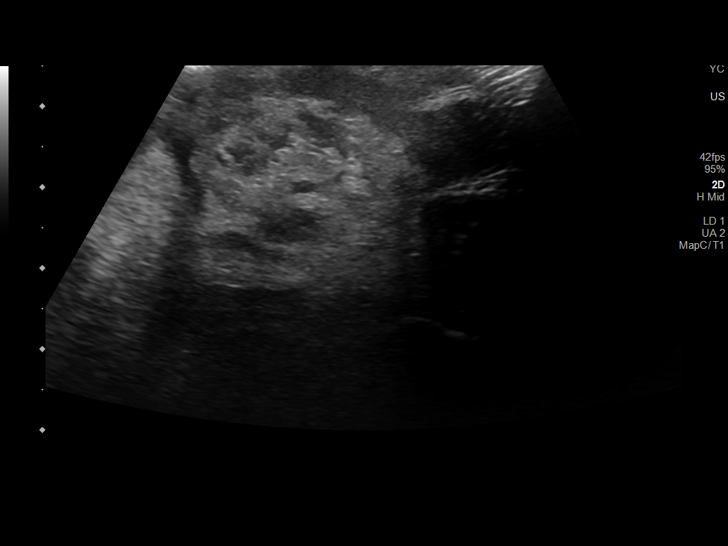
[im 56/61]
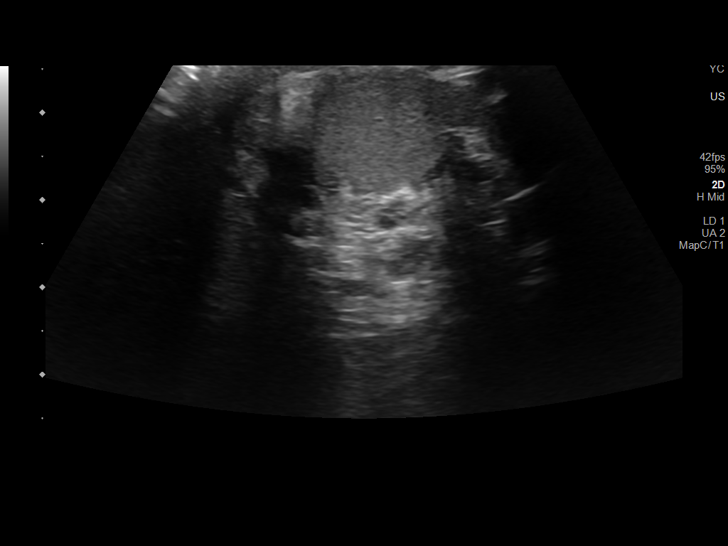
[im 61/61]
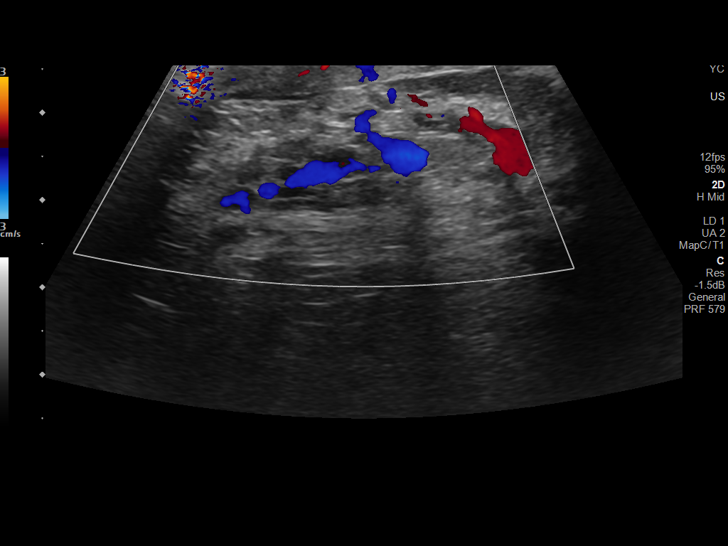

[13 of 25 positions shown; findings below may reference images not displayed]

FINDINGS: Right testicle

Measurements: 4.5 x 3.0 x 3.3 cm. No mass or microlithiasis
visualized.

Left testicle

Measurements: 4.6 x 2.7 x 2.8 cm. No mass or microlithiasis
visualized.

Right epididymis: The right epididymis is diffusely enlarged and
edematous. The right epididymis is diffusely hyperemic. No
well-defined extratesticular mass on the right.

Left epididymis: Normal in size and appearance. No edema or
hyperemia.

Hydrocele: Small hydrocele on the right. Minimal hydrocele on the
left.

Varicocele:  None visualized.

Pulsed Doppler interrogation of both testes demonstrates normal low
resistance arterial and venous waveforms bilaterally.

Scrotal wall thickening noted on the right.
IMPRESSION: 1. Enlarged, edematous, and hyperemic right epididymis consistent
with right-sided epididymitis. No right epididymal mass.

2.  Left epididymis appears normal.

3. No intratesticular mass on either side. No orchitis or testicular
torsion on either side.

4.  Small hydrocele on the right.  Minimal hydrocele on the left.

5. Scrotal wall thickening on the right is probably due to the
adjacent epididymitis.

## 2022-08-04 ENCOUNTER — Emergency Department (HOSPITAL_COMMUNITY)
Admission: EM | Admit: 2022-08-04 | Discharge: 2022-08-04 | Disposition: A | Discharge status: Home / Self Care | Payer: No Typology Code available for payment source | Attending: Emergency Medicine | Admitting: Emergency Medicine

## 2022-08-04 ENCOUNTER — Emergency Department (HOSPITAL_COMMUNITY): Payer: No Typology Code available for payment source

## 2022-08-04 ENCOUNTER — Other Ambulatory Visit: Payer: Self-pay

## 2022-08-04 ENCOUNTER — Encounter (HOSPITAL_COMMUNITY): Payer: Self-pay

## 2022-08-04 DIAGNOSIS — R519 Headache, unspecified: Secondary | ICD-10-CM | POA: Diagnosis not present

## 2022-08-04 DIAGNOSIS — M542 Cervicalgia: Secondary | ICD-10-CM | POA: Diagnosis not present

## 2022-08-04 DIAGNOSIS — R0789 Other chest pain: Secondary | ICD-10-CM | POA: Insufficient documentation

## 2022-08-04 DIAGNOSIS — Y92410 Unspecified street and highway as the place of occurrence of the external cause: Secondary | ICD-10-CM | POA: Diagnosis not present

## 2022-08-04 LAB — I-STAT CHEM 8, ED
BUN: 16 mg/dL (ref 6–20)
Calcium, Ion: 1.15 mmol/L (ref 1.15–1.40)
Chloride: 104 mmol/L (ref 98–111)
Creatinine, Ser: 0.7 mg/dL (ref 0.61–1.24)
Glucose, Bld: 156 mg/dL — ABNORMAL HIGH (ref 70–99)
HCT: 40 % (ref 39.0–52.0)
Hemoglobin: 13.6 g/dL (ref 13.0–17.0)
Potassium: 4.2 mmol/L (ref 3.5–5.1)
Sodium: 138 mmol/L (ref 135–145)
TCO2: 26 mmol/L (ref 22–32)

## 2022-08-04 MED ORDER — ACETAMINOPHEN 500 MG PO TABS
1000.0000 mg | ORAL_TABLET | Freq: Once | ORAL | Status: AC
  Administered 2022-08-04: 1000 mg via ORAL
  Filled 2022-08-04: qty 2

## 2022-08-04 MED ORDER — IOHEXOL 300 MG/ML  SOLN
100.0000 mL | Freq: Once | INTRAMUSCULAR | Status: AC | PRN
  Administered 2022-08-04: 100 mL via INTRAVENOUS

## 2022-08-04 MED ORDER — IBUPROFEN 400 MG PO TABS
600.0000 mg | ORAL_TABLET | Freq: Once | ORAL | Status: AC
  Administered 2022-08-04: 600 mg via ORAL
  Filled 2022-08-04: qty 2
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
## 2022-08-04 NOTE — ED Notes (Signed)
Patient given a glass of water at this time.

## 2022-08-04 NOTE — ED Notes (Signed)
Patient ambulated without difficulty, steady gait, and independently
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
## 2023-08-17 ENCOUNTER — Other Ambulatory Visit: Payer: Self-pay | Admitting: Physician Assistant

## 2023-08-17 ENCOUNTER — Other Ambulatory Visit: Payer: Self-pay

## 2023-08-17 ENCOUNTER — Encounter: Payer: Self-pay | Admitting: Physician Assistant

## 2023-08-17 ENCOUNTER — Ambulatory Visit: Payer: Self-pay | Admitting: Physician Assistant

## 2023-08-17 DIAGNOSIS — M79605 Pain in left leg: Secondary | ICD-10-CM

## 2023-08-17 DIAGNOSIS — M6702 Short Achilles tendon (acquired), left ankle: Secondary | ICD-10-CM

## 2023-08-17 MED ORDER — METHYLPREDNISOLONE ACETATE 40 MG/ML IJ SUSP
40.0000 mg | INTRAMUSCULAR | Status: AC | PRN
  Administered 2023-08-17: 40 mg via INTRA_ARTICULAR

## 2023-08-17 MED ORDER — LIDOCAINE HCL 1 % IJ SOLN
3.0000 mL | INTRAMUSCULAR | Status: AC | PRN
  Administered 2023-08-17: 3 mL
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
## 2023-08-25 ENCOUNTER — Ambulatory Visit (INDEPENDENT_AMBULATORY_CARE_PROVIDER_SITE_OTHER): Payer: Self-pay | Admitting: Orthopedic Surgery

## 2023-08-25 ENCOUNTER — Encounter: Payer: Self-pay | Admitting: Orthopedic Surgery

## 2023-08-25 DIAGNOSIS — M25562 Pain in left knee: Secondary | ICD-10-CM

## 2023-08-25 DIAGNOSIS — M6702 Short Achilles tendon (acquired), left ankle: Secondary | ICD-10-CM

## 2023-08-25 DIAGNOSIS — G8929 Other chronic pain: Secondary | ICD-10-CM
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
## 2023-08-30 ENCOUNTER — Telehealth: Payer: Self-pay

## 2023-08-30 NOTE — Telephone Encounter (Signed)
Patient called wanting to know when his surgery would be scheduled.  Cb# [phone].  Please advise.  Thank you.

## 2023-09-05 ENCOUNTER — Telehealth: Payer: Self-pay

## 2023-09-05 NOTE — Telephone Encounter (Signed)
Patient called checking status of surgery date.  Stated that he is not able to walk and is having a lot of pain.  Please advise on surgery date. Cb#  [phone].   Thank you.

## 2023-09-05 NOTE — Telephone Encounter (Signed)
I called via Spanish Interpreter, there was no answer and the interpreter left a message on the patient's voicemail to please return my call.

## 2023-09-20 ENCOUNTER — Telehealth: Payer: Self-pay

## 2023-09-20 NOTE — Telephone Encounter (Signed)
Patient was returning your call concerning scheduling surgery.  Please return his call at [phone].

## 2023-10-03 ENCOUNTER — Encounter (HOSPITAL_COMMUNITY): Payer: Self-pay | Admitting: Orthopedic Surgery

## 2023-10-03 ENCOUNTER — Other Ambulatory Visit: Payer: Self-pay

## 2023-10-03 NOTE — Progress Notes (Addendum)
SDW CALL  Spanish interpreter used for the SDW call.   Patient was given pre-op instructions over the phone. The opportunity was given for the patient to ask questions. No further questions asked. Patient verbalized understanding of instructions given.   PCP - denies Cardiologist -denies   PPM/ICD - denies   Chest x-ray - denies EKG - denies Stress Test - denies ECHO - denies Cardiac Cath - denies  Sleep Study - denies   Fasting Blood Sugar - n/a   Blood Thinner Instructions: n/a Aspirin Instructions: n/a  ERAS Protcol - clears until 0530   COVID TEST- n/a   Anesthesia review: no  Patient denies shortness of breath, fever, cough and chest pain over the phone call   All instructions explained to the patient, with a verbal understanding of the material. Patient agrees to go over the instructions while at home for a better understanding.

## 2023-10-05 ENCOUNTER — Ambulatory Visit (HOSPITAL_BASED_OUTPATIENT_CLINIC_OR_DEPARTMENT_OTHER): Payer: Self-pay | Admitting: Anesthesiology

## 2023-10-05 ENCOUNTER — Encounter (HOSPITAL_COMMUNITY): Payer: Self-pay | Admitting: Orthopedic Surgery

## 2023-10-05 ENCOUNTER — Ambulatory Visit (HOSPITAL_COMMUNITY): Payer: Self-pay | Admitting: Anesthesiology

## 2023-10-05 ENCOUNTER — Ambulatory Visit (HOSPITAL_COMMUNITY)
Admission: RE | Admit: 2023-10-05 | Discharge: 2023-10-05 | Disposition: A | Discharge status: Home / Self Care | Payer: Self-pay | Attending: Orthopedic Surgery | Admitting: Orthopedic Surgery

## 2023-10-05 ENCOUNTER — Encounter (HOSPITAL_COMMUNITY)
Admission: RE | Disposition: A | Discharge status: Home / Self Care | Payer: Self-pay | Source: Home / Self Care | Attending: Orthopedic Surgery

## 2023-10-05 DIAGNOSIS — M6702 Short Achilles tendon (acquired), left ankle: Secondary | ICD-10-CM | POA: Insufficient documentation

## 2023-10-05 DIAGNOSIS — Z87891 Personal history of nicotine dependence: Secondary | ICD-10-CM | POA: Insufficient documentation

## 2023-10-05 DIAGNOSIS — Z01818 Encounter for other preprocedural examination: Secondary | ICD-10-CM

## 2023-10-05 HISTORY — DX: Presence of dental prosthetic device (complete) (partial): Z97.2

## 2023-10-05 HISTORY — PX: ACHILLES TENDON LENGTHENING: SHX6455

## 2023-10-05 LAB — CBC
HCT: 41.6 % (ref 39.0–52.0)
Hemoglobin: 13.8 g/dL (ref 13.0–17.0)
MCH: 28.8 pg (ref 26.0–34.0)
MCHC: 33.2 g/dL (ref 30.0–36.0)
MCV: 86.8 fL (ref 80.0–100.0)
Platelets: 252 10*3/uL (ref 150–400)
RBC: 4.79 MIL/uL (ref 4.22–5.81)
RDW: 13.5 % (ref 11.5–15.5)
WBC: 8.9 10*3/uL (ref 4.0–10.5)
nRBC: 0 % (ref 0.0–0.2)

## 2023-10-05 SURGERY — LENGTHENING, TENDON, ACHILLES
Anesthesia: Regional | Laterality: Left

## 2023-10-05 MED ORDER — GLYCOPYRROLATE PF 0.2 MG/ML IJ SOSY
PREFILLED_SYRINGE | INTRAMUSCULAR | Status: AC
  Filled 2023-10-05: qty 1

## 2023-10-05 MED ORDER — LIDOCAINE 2% (20 MG/ML) 5 ML SYRINGE
INTRAMUSCULAR | Status: DC | PRN
  Administered 2023-10-05: 60 mg via INTRAVENOUS

## 2023-10-05 MED ORDER — PROPOFOL 10 MG/ML IV BOLUS
INTRAVENOUS | Status: AC
  Filled 2023-10-05: qty 20

## 2023-10-05 MED ORDER — FENTANYL CITRATE (PF) 100 MCG/2ML IJ SOLN: 25.0000 ug | INTRAMUSCULAR | Status: DC | PRN

## 2023-10-05 MED ORDER — OXYCODONE HCL 5 MG/5ML PO SOLN: 5.0000 mg | Freq: Once | ORAL | Status: DC | PRN

## 2023-10-05 MED ORDER — ONDANSETRON HCL 4 MG/2ML IJ SOLN
INTRAMUSCULAR | Status: DC | PRN
  Administered 2023-10-05: 4 mg via INTRAVENOUS

## 2023-10-05 MED ORDER — PROPOFOL 1000 MG/100ML IV EMUL
INTRAVENOUS | Status: AC
  Filled 2023-10-05: qty 100

## 2023-10-05 MED ORDER — MIDAZOLAM HCL 2 MG/2ML IJ SOLN
INTRAMUSCULAR | Status: DC | PRN
  Administered 2023-10-05: 2 mg via INTRAVENOUS

## 2023-10-05 MED ORDER — CHLORHEXIDINE GLUCONATE 0.12 % MT SOLN: 15.0000 mL | Freq: Once | OROMUCOSAL | Status: AC

## 2023-10-05 MED ORDER — GLYCOPYRROLATE 0.2 MG/ML IJ SOLN
INTRAMUSCULAR | Status: DC | PRN
  Administered 2023-10-05 (×2): .1 mg via INTRAVENOUS

## 2023-10-05 MED ORDER — FENTANYL CITRATE (PF) 250 MCG/5ML IJ SOLN
INTRAMUSCULAR | Status: DC | PRN
  Administered 2023-10-05: 50 ug via INTRAVENOUS

## 2023-10-05 MED ORDER — OXYCODONE HCL 5 MG PO TABS: 5.0000 mg | ORAL_TABLET | Freq: Once | ORAL | Status: DC | PRN

## 2023-10-05 MED ORDER — CHLORHEXIDINE GLUCONATE 0.12 % MT SOLN
OROMUCOSAL | Status: AC
  Administered 2023-10-05: 15 mL via OROMUCOSAL
  Filled 2023-10-05: qty 15

## 2023-10-05 MED ORDER — MIDAZOLAM HCL 2 MG/2ML IJ SOLN
INTRAMUSCULAR | Status: AC
  Filled 2023-10-05: qty 2

## 2023-10-05 MED ORDER — DEXAMETHASONE SODIUM PHOSPHATE 10 MG/ML IJ SOLN
INTRAMUSCULAR | Status: DC | PRN
  Administered 2023-10-05: 4 mg via INTRAVENOUS

## 2023-10-05 MED ORDER — CEFAZOLIN SODIUM-DEXTROSE 2-4 GM/100ML-% IV SOLN
2.0000 g | INTRAVENOUS | Status: AC
  Administered 2023-10-05: 2 g via INTRAVENOUS

## 2023-10-05 MED ORDER — PROPOFOL 10 MG/ML IV BOLUS
INTRAVENOUS | Status: DC | PRN
  Administered 2023-10-05: 200 mg via INTRAVENOUS

## 2023-10-05 MED ORDER — FENTANYL CITRATE (PF) 250 MCG/5ML IJ SOLN
INTRAMUSCULAR | Status: AC
  Filled 2023-10-05: qty 5

## 2023-10-05 MED ORDER — ORAL CARE MOUTH RINSE: 15.0000 mL | Freq: Once | OROMUCOSAL | Status: AC

## 2023-10-05 MED ORDER — OXYCODONE-ACETAMINOPHEN 5-325 MG PO TABS: 1.0000 | ORAL_TABLET | ORAL | 0 refills | Status: DC | PRN

## 2023-10-05 MED ORDER — ROPIVACAINE HCL 5 MG/ML IJ SOLN
INTRAMUSCULAR | Status: DC | PRN
  Administered 2023-10-05: 15 mL via PERINEURAL
  Administered 2023-10-05: 25 mL via PERINEURAL

## 2023-10-05 MED ORDER — 0.9 % SODIUM CHLORIDE (POUR BTL) OPTIME
TOPICAL | Status: DC | PRN
  Administered 2023-10-05: 1000 mL

## 2023-10-05 MED ORDER — LACTATED RINGERS IV SOLN
INTRAVENOUS | Status: DC
Start: 2023-10-05 — End: 2023-10-05

## 2023-10-05 MED ORDER — PHENYLEPHRINE HCL-NACL 20-0.9 MG/250ML-% IV SOLN
INTRAVENOUS | Status: AC
Start: 2023-10-05 — End: ?
  Filled 2023-10-05: qty 250

## 2023-10-05 MED ORDER — ONDANSETRON HCL 4 MG/2ML IJ SOLN: 4.0000 mg | Freq: Four times a day (QID) | INTRAMUSCULAR | Status: DC | PRN

## 2023-10-05 MED ORDER — CEFAZOLIN SODIUM-DEXTROSE 2-4 GM/100ML-% IV SOLN
INTRAVENOUS | Status: AC
  Filled 2023-10-05: qty 100

## 2023-10-05 MED ORDER — LIDOCAINE 2% (20 MG/ML) 5 ML SYRINGE
INTRAMUSCULAR | Status: AC
  Filled 2023-10-05: qty 5

## 2023-10-05 SURGICAL SUPPLY — 24 items
BAG COUNTER SPONGE SURGICOUNT (BAG) ×1 IMPLANT
BNDG COHESIVE 6X5 TAN ST LF (GAUZE/BANDAGES/DRESSINGS) ×1 IMPLANT
BNDG GAUZE DERMACEA FLUFF 4 (GAUZE/BANDAGES/DRESSINGS) IMPLANT
COVER MAYO STAND STRL (DRAPES) ×1 IMPLANT
COVER SURGICAL LIGHT HANDLE (MISCELLANEOUS) ×1 IMPLANT
CUFF TOURN SGL QUICK 42 (TOURNIQUET CUFF) IMPLANT
CUFF TRNQT CYL 34X4.125X (TOURNIQUET CUFF) IMPLANT
DRAPE U-SHAPE 47X51 STRL (DRAPES) ×1 IMPLANT
DRSG EMULSION OIL 3X3 NADH (GAUZE/BANDAGES/DRESSINGS) ×1 IMPLANT
DURAPREP 26ML APPLICATOR (WOUND CARE) ×1 IMPLANT
ELECT REM PT RETURN 9FT ADLT (ELECTROSURGICAL) ×1
ELECTRODE REM PT RTRN 9FT ADLT (ELECTROSURGICAL) ×1 IMPLANT
GAUZE PAD ABD 8X10 STRL (GAUZE/BANDAGES/DRESSINGS) IMPLANT
GAUZE SPONGE 4X4 12PLY STRL (GAUZE/BANDAGES/DRESSINGS) ×1 IMPLANT
GLOVE BIOGEL PI IND STRL 9 (GLOVE) ×1 IMPLANT
GLOVE SURG ORTHO 9.0 STRL STRW (GLOVE) ×1 IMPLANT
GOWN STRL REUS W/ TWL XL LVL3 (GOWN DISPOSABLE) ×2 IMPLANT
KIT BASIN OR (CUSTOM PROCEDURE TRAY) ×1 IMPLANT
KIT TURNOVER KIT B (KITS) ×1 IMPLANT
PACK ORTHO EXTREMITY (CUSTOM PROCEDURE TRAY) ×1 IMPLANT
PAD ARMBOARD 7.5X6 YLW CONV (MISCELLANEOUS) ×2 IMPLANT
PADDING CAST ABS COTTON 4X4 ST (CAST SUPPLIES) ×1 IMPLANT
UNDERPAD 30X36 HEAVY ABSORB (UNDERPADS AND DIAPERS) ×1 IMPLANT
WATER STERILE IRR 1000ML POUR (IV SOLUTION) ×1 IMPLANT

## 2023-10-05 NOTE — Anesthesia Procedure Notes (Signed)
Procedure Name: LMA Insertion Date/Time: 10/05/2023 8:44 AM  Performed by: Floydene Flock, CRNAPre-anesthesia Checklist: Patient identified, Emergency Drugs available, Suction available, Patient being monitored and Timeout performed Patient Re-evaluated:Patient Re-evaluated prior to induction Oxygen Delivery Method: Circle system utilized Preoxygenation: Pre-oxygenation with 100% oxygen Induction Type: IV induction LMA: LMA inserted LMA Size: 5.0 Number of attempts: 1 Placement Confirmation: positive ETCO2 Tube secured with: Tape

## 2023-10-05 NOTE — Anesthesia Procedure Notes (Signed)
Anesthesia Regional Block: Popliteal block   Pre-Anesthetic Checklist: , timeout performed,  Correct Patient, Correct Site, Correct Laterality,  Correct Procedure, Correct Position, site marked,  Risks and benefits discussed,  Surgical consent,  Pre-op evaluation,  At surgeon's request and post-op pain management  Laterality: Left  Prep: chloraprep       Needles:  Injection technique: Single-shot  Needle Type: Echogenic Stimulator Needle          Additional Needles:   Procedures:, nerve stimulator,,,,,     Nerve Stimulator or Paresthesia:  Response: plantar flexion of foot, 0.45 mA  Additional Responses:   Narrative:  Start time: 10/05/2023 7:53 AM End time: 10/05/2023 7:58 AM Injection made incrementally with aspirations every 5 mL.  Performed by: Personally  Anesthesiologist: Achille Rich, MD  Additional Notes: Functioning IV was confirmed and monitors were applied.  A 90mm 21ga Arrow echogenic stimulator needle was used. Sterile prep and drape,hand hygiene and sterile gloves were used.  Negative aspiration and negative test dose prior to incremental administration of local anesthetic. The patient tolerated the procedure well.  Ultrasound guidance: relevent anatomy identified, needle position confirmed, local anesthetic spread visualized around nerve(s), vascular puncture avoided.  Image printed for medical record.
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
## 2023-10-05 NOTE — Transfer of Care (Signed)
Immediate Anesthesia Transfer of Care Note  Patient: Donald [name]  Procedure(s) Performed: LEFT ACHILLES LENGTHENING (Left)  Patient Location: PACU  Anesthesia Type:General  Level of Consciousness: awake and drowsy  Airway & Oxygen Therapy: Patient Spontanous Breathing and Patient connected to face mask oxygen  Post-op Assessment: Report given to RN and Post -op Vital signs reviewed and stable  Post vital signs: Reviewed and stable  Last Vitals:  Vitals Value Taken Time  BP 111/71   Temp 97.6   Pulse 52 10/05/23 0915  Resp 13   SpO2 100 % 10/05/23 0915  Vitals shown include unfiled device data.  Last Pain:  Vitals:   10/05/23 0650  PainSc: 4       Patients Stated Pain Goal: 1 (10/05/23 0650)  Complications: No notable events documented.

## 2023-10-05 NOTE — Progress Notes (Signed)
Orthopedic Tech Progress Note Patient Details:  Donald [name] 1980-11-18 [id number] Applied Cam boot to LLE, pt declined crutch training due to using them in the past.   Ortho Devices Type of Ortho Device: CAM walker, Crutches Ortho Device/Splint Location: LLE Ortho Device/Splint Interventions: Application, Ordered, Adjustment   Post Interventions Patient Tolerated: Well Instructions Provided: Adjustment of device, Care of device  Diannia Ruder 10/05/2023, 10:00 AM

## 2023-10-05 NOTE — Op Note (Signed)
10/05/2023  9:13 AM  PATIENT:  Donald [name]    PRE-OPERATIVE DIAGNOSIS:  Left Achilles Contracture  POST-OPERATIVE DIAGNOSIS:  Same  PROCEDURE:  LEFT ACHILLES LENGTHENING  SURGEON:  Nadara Mustard, MD  PHYSICIAN ASSISTANT:None ANESTHESIA:   General  PREOPERATIVE INDICATIONS:  Donald [name] is a  42 y.o. male with a diagnosis of Left Achilles Contracture who failed conservative measures and elected for surgical management.    The risks benefits and alternatives were discussed with the patient preoperatively including but not limited to the risks of infection, bleeding, nerve injury, cardiopulmonary complications, the need for revision surgery, among others, and the patient was willing to proceed.  OPERATIVE IMPLANTS:   * No implants in log *  @ENCIMAGES @  OPERATIVE FINDINGS: Patient had a fixed equinus contracture of 30 degrees.  After Z-lengthening of the Achilles patient had dorsiflexion of 10 degrees with the knee extended.  OPERATIVE PROCEDURE: Patient was brought the operating room and underwent a general anesthetic.  After adequate levels anesthesia were obtained patient's left lower extremity was prepped using DuraPrep draped into a sterile field a timeout was called.  An 11 blade knife was used to Z-lengthening the Achilles.  The first incision was made distal medially to release the distal medial half of the Achilles the next incision was made mid substance of the Achilles mid laterally and a more proximal release was performed mid medially.  This allowed the Achilles contracture to achieve dorsiflexion 10 degrees past neutral.  The wounds were irrigated normal saline incision was closed using 2-0 nylon.  Sterile dressing was applied patient was extubated taken the PACU in stable condition.   DISCHARGE PLANNING:  Antibiotic duration: Preoperative antibiotics   Weightbearing: Weightbearing as tolerated  Pain medication: Prescription for Percocet  Dressing care/ Wound VAC:  Dry dressing  Ambulatory devices: Crutches  Discharge to: Home.  Follow-up: In the office 1 week post operative.

## 2023-10-05 NOTE — Anesthesia Preprocedure Evaluation (Signed)
Anesthesia Evaluation  Patient identified by MRN, date of birth, ID band Patient awake    Reviewed: Allergy & Precautions, H&P , NPO status , Patient's Chart, lab work & pertinent test results  Airway Mallampati: II   Neck ROM: full    Dental   Pulmonary former smoker   breath sounds clear to auscultation       Cardiovascular negative cardio ROS  Rhythm:regular Rate:Normal     Neuro/Psych    GI/Hepatic   Endo/Other    Renal/GU      Musculoskeletal   Abdominal   Peds  Hematology   Anesthesia Other Findings   Reproductive/Obstetrics                             Anesthesia Physical Anesthesia Plan  ASA: 2  Anesthesia Plan: General   Post-op Pain Management: Regional block*   Induction: Intravenous  PONV Risk Score and Plan: 2 and Ondansetron, Dexamethasone, Midazolam and Treatment may vary due to age or medical condition  Airway Management Planned: LMA  Additional Equipment:   Intra-op Plan:   Post-operative Plan: Extubation in OR  Informed Consent: I have reviewed the patients History and Physical, chart, labs and discussed the procedure including the risks, benefits and alternatives for the proposed anesthesia with the patient or authorized representative who has indicated his/her understanding and acceptance.     Dental advisory given  Plan Discussed with: CRNA, Anesthesiologist and Surgeon  Anesthesia Plan Comments:        Anesthesia Quick Evaluation

## 2023-10-05 NOTE — Anesthesia Procedure Notes (Signed)
Anesthesia Regional Block: Adductor canal block   Pre-Anesthetic Checklist: , timeout performed,  Correct Patient, Correct Site, Correct Laterality,  Correct Procedure, Correct Position, site marked,  Risks and benefits discussed,  Surgical consent,  Pre-op evaluation,  At surgeon's request and post-op pain management  Laterality: Left  Prep: chloraprep       Needles:  Injection technique: Single-shot  Needle Type: Echogenic Needle     Needle Length: 9cm  Needle Gauge: 21     Additional Needles:   Narrative:  Start time: 10/05/2023 7:57 AM End time: 10/05/2023 8:03 AM Injection made incrementally with aspirations every 5 mL.  Performed by: Personally  Anesthesiologist: Achille Rich, MD  Additional Notes: Pt tolerated the procedure well.

## 2023-10-06 ENCOUNTER — Encounter (HOSPITAL_COMMUNITY): Payer: Self-pay | Admitting: Orthopedic Surgery

## 2023-10-06 NOTE — Anesthesia Postprocedure Evaluation (Signed)
Anesthesia Post Note  Patient: Donald [name]  Procedure(s) Performed: LEFT ACHILLES LENGTHENING (Left)     Patient location during evaluation: PACU Anesthesia Type: Regional and General Level of consciousness: awake and alert Pain management: pain level controlled Vital Signs Assessment: post-procedure vital signs reviewed and stable Respiratory status: spontaneous breathing, nonlabored ventilation, respiratory function stable and patient connected to nasal cannula oxygen Cardiovascular status: blood pressure returned to baseline and stable Postop Assessment: no apparent nausea or vomiting Anesthetic complications: no   No notable events documented.  Last Vitals:  Vitals:   10/05/23 0930 10/05/23 0946  BP: 105/67 102/74  Pulse: (!) 43 (!) 53  Resp: 16 18  Temp:  36.5 C  SpO2: 98% 97%    Last Pain:  Vitals:   10/05/23 0946  PainSc: 0-No pain                 Laketa Sandoz S

## 2023-10-13 ENCOUNTER — Ambulatory Visit: Payer: Self-pay | Admitting: Orthopedic Surgery

## 2023-10-13 ENCOUNTER — Encounter: Payer: Self-pay | Admitting: Orthopedic Surgery

## 2023-10-13 VITALS — Ht 74.0 in | Wt 245.0 lb

## 2023-10-13 DIAGNOSIS — G8929 Other chronic pain: Secondary | ICD-10-CM

## 2023-10-13 DIAGNOSIS — M6702 Short Achilles tendon (acquired), left ankle: Secondary | ICD-10-CM

## 2023-10-13 DIAGNOSIS — M25562 Pain in left knee: Secondary | ICD-10-CM

## 2023-10-13 DIAGNOSIS — M79605 Pain in left leg: Secondary | ICD-10-CM

## 2023-10-13 NOTE — Progress Notes (Signed)
Office Visit Note   Patient: Donald [name]           Date of Birth: [birth year]           MRN: [id number] Visit Date: 10/13/2023              Requested by: No referring provider defined for this encounter. PCP: Patient, No Pcp Per  Chief Complaint  Patient presents with   Left Ankle - Routine Post Op    10/05/2023 left achilles lengthening      HPI: Patient is a 42 year old gentleman who is seen 1 week status post left Achilles Z-lengthening.  Patient states he feels much better patient does have some pain when walking in the cam boot.  Assessment & Plan: Visit Diagnoses:  1. Achilles tendon contracture, left   2. Chronic pain of left knee   3. Pain in left leg     Plan: Sutures are harvested patient was given instructions and demonstrated Achilles stretching.  Patient is self-employed and he may return to work when he feels comfortable.  Follow-Up Instructions: Return in about 4 weeks (around 11/10/2023).   Ortho Exam  Patient is alert, oriented, no adenopathy, well-dressed, normal affect, normal respiratory effort. Examination the incisions are well-healed the sutures are harvested.  Patient has dorsiflexion to neutral.  Patient was given instructions and demonstrated Achilles stretching.  With patient standing he does have leg length inequality approximately 1/4 inch shorter on the left.  Shortening most likely secondary to intramedullary nailing of the femur and tibia fracture.  Imaging: No results found. No images are attached to the encounter.  Labs: Lab Results  Component Value Date   REPTSTATUS 04/06/2021 FINAL 04/04/2021   CULT 10,000 COLONIES/mL ESCHERICHIA COLI (A) 04/04/2021   LABORGA ESCHERICHIA COLI (A) 04/04/2021     No results found for: "ALBUMIN", "PREALBUMIN", "CBC"  No results found for: "MG" No results found for: "VD25OH"  No results found for: "PREALBUMIN"    Latest Ref Rng & Units 10/05/2023    6:47 AM 08/04/2022    2:51 PM 04/04/2021    8:09  AM  CBC EXTENDED  WBC 4.0 - 10.5 K/uL 8.9   14.2   RBC 4.22 - 5.81 MIL/uL 4.79   4.43   Hemoglobin 13.0 - 17.0 g/dL 40.9  81.1  91.4   HCT 39.0 - 52.0 % 41.6  40.0  38.8   Platelets 150 - 400 K/uL 252   250   NEUT# 1.7 - 7.7 K/uL   10.7   Lymph# 0.7 - 4.0 K/uL   2.0      Body mass index is 31.46 kg/m.  Orders:  No orders of the defined types were placed in this encounter.  No orders of the defined types were placed in this encounter.    Procedures: No procedures performed  Clinical Data: No additional findings.  ROS:  All other systems negative, except as noted in the HPI. Review of Systems  Objective: Vital Signs: Ht 6\' 2"  (1.88 m)   Wt 245 lb (111.1 kg)   BMI 31.46 kg/m   Specialty Comments:  No specialty comments available.  PMFS History: Patient Active Problem List   Diagnosis Date Noted   Achilles tendon contracture, left 08/17/2023   Past Medical History:  Diagnosis Date   Wears dentures     History reviewed. No pertinent family history.  Past Surgical History:  Procedure Laterality Date   ACHILLES TENDON LENGTHENING Left 10/05/2023   Procedure: LEFT ACHILLES LENGTHENING;  Surgeon: Nadara Mustard, MD;  Location: [facility] [place];  Service: Orthopedics;  Laterality: Left;   MULTIPLE TOOTH EXTRACTIONS     Social History   Occupational History   Not on file  Tobacco Use   Smoking status: Former    Current packs/day: 0.00    Types: Cigarettes    Quit date: 09/09/2020    Years since quitting: 3.0   Smokeless tobacco: Never  Vaping Use   Vaping status: Never Used  Substance and Sexual Activity   Alcohol use: No   Drug use: No   Sexual activity: Not on file

## 2023-11-10 ENCOUNTER — Ambulatory Visit (INDEPENDENT_AMBULATORY_CARE_PROVIDER_SITE_OTHER): Payer: Self-pay | Admitting: Orthopedic Surgery

## 2023-11-10 DIAGNOSIS — M6702 Short Achilles tendon (acquired), left ankle: Secondary | ICD-10-CM

## 2023-11-14 ENCOUNTER — Encounter: Payer: Self-pay | Admitting: Orthopedic Surgery

## 2023-11-14 NOTE — Progress Notes (Signed)
 Office Visit Note   Patient: Donald [name]           Date of Birth: 04/12/1981           MRN: [id number] Visit Date: 11/10/2023              Requested by: No referring provider defined for this encounter. PCP: Patient, No Pcp Per  Chief Complaint  Patient presents with   Left Ankle - Routine Post Op      HPI: Patient is a 43 year old gentleman who presents 5 weeks status post left Achilles lengthening.  Patient states he feels like he is getting better.  He feels like he is walking better.  Assessment & Plan: Visit Diagnoses:  1. Achilles tendon contracture, left     Plan: Continue with Achilles stretching and anticipate return to work as patient continues to improve.  Follow-Up Instructions: No follow-ups on file.   Ortho Exam  Patient is alert, oriented, no adenopathy, well-dressed, normal affect, normal respiratory effort. Examination with the knee extended patient has dorsiflexion to neutral.  The incisions are well-healed.  Imaging: No results found. No images are attached to the encounter.  Labs: Lab Results  Component Value Date   REPTSTATUS 04/06/2021 FINAL 04/04/2021   CULT 10,000 COLONIES/mL ESCHERICHIA COLI (A) 04/04/2021   LABORGA ESCHERICHIA COLI (A) 04/04/2021     No results found for: ALBUMIN, PREALBUMIN, CBC  No results found for: MG No results found for: VD25OH  No results found for: PREALBUMIN    Latest Ref Rng & Units 10/05/2023    6:47 AM 08/04/2022    2:51 PM 04/04/2021    8:09 AM  CBC EXTENDED  WBC 4.0 - 10.5 K/uL 8.9   14.2   RBC 4.22 - 5.81 MIL/uL 4.79   4.43   Hemoglobin 13.0 - 17.0 g/dL 86.1  86.3  86.8   HCT 39.0 - 52.0 % 41.6  40.0  38.8   Platelets 150 - 400 K/uL 252   250   NEUT# 1.7 - 7.7 K/uL   10.7   Lymph# 0.7 - 4.0 K/uL   2.0      There is no height or weight on file to calculate BMI.  Orders:  No orders of the defined types were placed in this encounter.  No orders of the defined types were placed  in this encounter.    Procedures: No procedures performed  Clinical Data: No additional findings.  ROS:  All other systems negative, except as noted in the HPI. Review of Systems  Objective: Vital Signs: There were no vitals taken for this visit.  Specialty Comments:  No specialty comments available.  PMFS History: Patient Active Problem List   Diagnosis Date Noted   Achilles tendon contracture, left 08/17/2023   Past Medical History:  Diagnosis Date   Wears dentures     History reviewed. No pertinent family history.  Past Surgical History:  Procedure Laterality Date   ACHILLES TENDON LENGTHENING Left 10/05/2023   Procedure: LEFT ACHILLES LENGTHENING;  Surgeon: Harden Jerona GAILS, MD;  Location: [facility] OR;  Service: Orthopedics;  Laterality: Left;   MULTIPLE TOOTH EXTRACTIONS     Social History   Occupational History   Not on file  Tobacco Use   Smoking status: Former    Current packs/day: 0.00    Types: Cigarettes    Quit date: 09/09/2020    Years since quitting: 3.1   Smokeless tobacco: Never  Vaping Use   Vaping status: Never Used  Substance and Sexual Activity   Alcohol use: No   Drug use: No   Sexual activity: Not on file

## 2023-12-08 ENCOUNTER — Ambulatory Visit (INDEPENDENT_AMBULATORY_CARE_PROVIDER_SITE_OTHER): Payer: Self-pay | Admitting: Orthopedic Surgery

## 2023-12-08 DIAGNOSIS — M6702 Short Achilles tendon (acquired), left ankle: Secondary | ICD-10-CM

## 2023-12-12 ENCOUNTER — Encounter: Payer: Self-pay | Admitting: Orthopedic Surgery
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
## 2024-02-05 ENCOUNTER — Ambulatory Visit (HOSPITAL_COMMUNITY)
Admission: EM | Admit: 2024-02-05 | Discharge: 2024-02-05 | Disposition: A | Discharge status: Home / Self Care | Payer: Self-pay | Attending: Physician Assistant | Admitting: Physician Assistant

## 2024-02-05 ENCOUNTER — Encounter (HOSPITAL_COMMUNITY): Payer: Self-pay

## 2024-02-05 DIAGNOSIS — S61216A Laceration without foreign body of right little finger without damage to nail, initial encounter: Secondary | ICD-10-CM

## 2024-02-05 DIAGNOSIS — Z23 Encounter for immunization: Secondary | ICD-10-CM

## 2024-02-05 MED ORDER — TETANUS-DIPHTH-ACELL PERTUSSIS 5-2.5-18.5 LF-MCG/0.5 IM SUSY
0.5000 mL | PREFILLED_SYRINGE | Freq: Once | INTRAMUSCULAR | Status: AC
  Administered 2024-02-05: 0.5 mL via INTRAMUSCULAR

## 2024-02-05 MED ORDER — DOXYCYCLINE HYCLATE 100 MG PO CAPS: 100.0000 mg | ORAL_CAPSULE | Freq: Two times a day (BID) | ORAL | 0 refills | Status: AC

## 2024-02-05 MED ORDER — MUPIROCIN 2 % EX OINT: 1.0000 | TOPICAL_OINTMENT | Freq: Two times a day (BID) | CUTANEOUS | 0 refills | Status: AC

## 2024-02-05 MED ORDER — TETANUS-DIPHTH-ACELL PERTUSSIS 5-2.5-18.5 LF-MCG/0.5 IM SUSY
PREFILLED_SYRINGE | INTRAMUSCULAR | Status: AC
  Filled 2024-02-05: qty 0.5

## 2024-02-05 NOTE — ED Triage Notes (Addendum)
 Patient was putting up a metal fence yesterday and cut his right pinky finger. Bleeding slightly.  Patient doe snot know if he has had a tetanus shot.

## 2024-02-05 NOTE — Discharge Instructions (Signed)
 Keep this area clean with soap and water.  Apply Bactroban ointment with dressing changes at least twice a day.  Start doxycycline twice daily for 5 days.  Stay out of the sun while on this medication as it can cause a sunburn.  We are going to allow this to heal on its own.  If there are any signs of infection including redness, swelling of the skin around the wound, drainage from the wound, pain, numbness or tingling in the finger, trouble moving it, fever you need to be seen immediately.  Mantenga esta zona limpia con agua y [place]. Aplique el ungento Bactroban con los cambios de apsito al [employer] veces al da. Comience con Glass blower/designer al da durante 5 [place]. Evite la exposicin al sol mientras est tomando [employer], ya que puede causar [employer]. Dejaremos que la herida sane sola. Si presenta signos de infeccin, como enrojecimiento, inflamacin de la piel alrededor de la herida, supuracin, dolor, entumecimiento u hormigueo en el dedo, dificultad para moverlo o fiebre, debe ser examinado de inmediato.

## 2024-02-05 NOTE — ED Provider Notes (Signed)
 [facility]    CSN: [id number] Arrival date & time: 02/05/24  1438      History   Chief Complaint Chief Complaint  Patient presents with   Laceration    HPI Donald [name] is a 43 y.o. male.   Patient presents today accompanied by his family member who help provide the majority of history.  Reports that yesterday early afternoon he was working on a fence when he cut the lateral portion of his right middle finger.  He initially cleaned this with hydrogen peroxide and has been dressing it.  He reports that pain is rated 6 on a 0-10 pain scale, described as aching, worse with manipulation of the finger, no alleviating factors identified.  He is right-handed.  Denies any numbness or paresthesias.  He is able to move the finger without difficulty.  He is unsure when his last tetanus was.    Past Medical History:  Diagnosis Date   Wears dentures     Patient Active Problem List   Diagnosis Date Noted   Achilles tendon contracture, left 08/17/2023    Past Surgical History:  Procedure Laterality Date   ACHILLES TENDON LENGTHENING Left 10/05/2023   Procedure: LEFT ACHILLES LENGTHENING;  Surgeon: Nadara Mustard, MD;  Location: [facility] OR;  Service: Orthopedics;  Laterality: Left;   MULTIPLE TOOTH EXTRACTIONS         Home Medications    Prior to Admission medications   Medication Sig Start Date End Date Taking? Authorizing Provider  doxycycline (VIBRAMYCIN) 100 MG capsule Take 1 capsule (100 mg total) by mouth 2 (two) times daily. 02/05/24  Yes Rafel Garde, Denny Peon K, PA-C  mupirocin ointment (BACTROBAN) 2 % Apply 1 Application topically 2 (two) times daily. 02/05/24  Yes Dajon Rowe, Noberto Retort, PA-C  acetaminophen (TYLENOL) 500 MG tablet Take 500 mg by mouth every 8 (eight) hours as needed for moderate pain (pain score 4-6).    [provider]    Family History History reviewed. No pertinent family history.  Social History Social History   Tobacco Use   Smoking status: Former     Current packs/day: 0.00    Types: Cigarettes    Quit date: 09/09/2020    Years since quitting: 3.4   Smokeless tobacco: Never  Vaping Use   Vaping status: Never Used  Substance Use Topics   Alcohol use: No   Drug use: No     Allergies   Patient has no known allergies.   Review of Systems Review of Systems  Constitutional:  Negative for activity change, appetite change, fatigue and fever.  Musculoskeletal:  Negative for arthralgias and myalgias.  Skin:  Positive for wound. Negative for color change.  Neurological:  Negative for weakness and numbness.     Physical Exam Triage Vital Signs ED Triage Vitals  Encounter Vitals Group     BP 02/05/24 1547 106/69     Systolic BP Percentile --      Diastolic BP Percentile --      Pulse Rate 02/05/24 1547 (!) 57     Resp 02/05/24 1547 14     Temp 02/05/24 1547 97.9 F (36.6 C)     Temp Source 02/05/24 1547 Oral     SpO2 02/05/24 1547 98 %     Weight --      Height --      Head Circumference --      Peak Flow --      Pain Score 02/05/24 1546 6  Pain Loc --      Pain Education --      Exclude from Growth Chart --    No data found.  Updated Vital Signs BP 106/69 (BP Location: Right Arm)   Pulse (!) 57   Temp 97.9 F (36.6 C) (Oral)   Resp 14   SpO2 98%   Visual Acuity Right Eye Distance:   Left Eye Distance:   Bilateral Distance:    Right Eye Near:   Left Eye Near:    Bilateral Near:     Physical Exam Vitals reviewed.  Constitutional:      General: He is awake.     Appearance: Normal appearance. He is well-developed. He is not ill-appearing.     Comments: Very pleasant male appears stated age in no acute distress sitting comfortably in exam room  HENT:     Head: Normocephalic and atraumatic.  Cardiovascular:     Rate and Rhythm: Normal rate and regular rhythm.     Heart sounds: Normal heart sounds, S1 normal and S2 normal. No murmur heard.    Comments: Capillary fill within 2 seconds right  finger Pulmonary:     Effort: Pulmonary effort is normal.     Breath sounds: Normal breath sounds. No stridor. No wheezing, rhonchi or rales.     Comments: Clear to auscultation bilaterally Musculoskeletal:     Comments: Normal active range of motion of the right little finger with flexion and extension.  Skin:    Findings: Laceration present.     Comments: 2 cm laceration with granulated tissue noted lateral right little finger without active bleeding.  No surrounding erythema or drainage.  Neurological:     Mental Status: He is alert.  Psychiatric:        Behavior: Behavior is cooperative.      UC Treatments / Results  Labs (all labs ordered are listed, but only abnormal results are displayed) Labs Reviewed - No data to display  EKG   Radiology No results found.  Procedures Procedures (including critical care time)  Medications Ordered in UC Medications  Tdap (BOOSTRIX) injection 0.5 mL (0.5 mLs Intramuscular Given 02/05/24 1620)    Initial Impression / Assessment and Plan / UC Course  I have reviewed the triage vital signs and the nursing notes.  Pertinent labs & imaging results that were available during my care of the patient were reviewed by me and considered in my medical decision making (see chart for details).     Patient is well-appearing, afebrile, nontoxic, nontachycardic.  X-rays were deferred as patient had no concern for retained foreign body.  Wound was cleaned in clinic and dressed.  Given he is already developed some granulation and has been within 24 hours since the injury we will allow this to heal by secondary intention.  He was encouraged to clean it with soap and water and apply Bactroban ointment with dressing changes which was sent to his pharmacy.  He can use Tylenol ibuprofen for pain.  Will start doxycycline twice daily for 5 days.  Discussed that he is to avoid prolonged sun exposure while on this medication due to associated photosensitivity.   If he has any worsening or changing symptoms including drainage, erythema, increasing pain, fever, numbness or tingling in the finger, decreased range of motion he is to be seen immediately.  Strict return precautions given.  Excuse note provided.  Tetanus updated in clinic today.  Final Clinical Impressions(s) / UC Diagnoses   Final diagnoses:  Laceration  of right little finger without foreign body without damage to nail, initial encounter     Discharge Instructions      Keep this area clean with soap and water.  Apply Bactroban ointment with dressing changes at least twice a day.  Start doxycycline twice daily for 5 days.  Stay out of the sun while on this medication as it can cause a sunburn.  We are going to allow this to heal on its own.  If there are any signs of infection including redness, swelling of the skin around the wound, drainage from the wound, pain, numbness or tingling in the finger, trouble moving it, fever you need to be seen immediately.  Mantenga esta zona limpia con agua y [place]. Aplique el ungento Bactroban con los cambios de apsito al [employer] veces al da. Comience con Glass blower/designer al da durante 5 [place]. Evite la exposicin al sol mientras est tomando [employer], ya que puede causar [employer]. Dejaremos que la herida sane sola. Si presenta signos de infeccin, como enrojecimiento, inflamacin de la piel alrededor de la herida, supuracin, dolor, entumecimiento u hormigueo en el dedo, dificultad para moverlo o fiebre, debe ser examinado de inmediato.    ED Prescriptions     Medication Sig Dispense Auth. Provider   mupirocin ointment (BACTROBAN) 2 % Apply 1 Application topically 2 (two) times daily. 22 g Daisy Lites K, PA-C   doxycycline (VIBRAMYCIN) 100 MG capsule Take 1 capsule (100 mg total) by mouth 2 (two) times daily. 10 capsule Amerie Beaumont, Noberto Retort, PA-C      PDMP not reviewed this encounter.   Jeani Hawking, PA-C 02/05/24 1626

## 2024-03-07 ENCOUNTER — Other Ambulatory Visit (INDEPENDENT_AMBULATORY_CARE_PROVIDER_SITE_OTHER): Payer: Self-pay

## 2024-03-07 ENCOUNTER — Ambulatory Visit (INDEPENDENT_AMBULATORY_CARE_PROVIDER_SITE_OTHER): Payer: Self-pay | Admitting: Orthopedic Surgery

## 2024-03-07 DIAGNOSIS — G8929 Other chronic pain: Secondary | ICD-10-CM

## 2024-03-07 DIAGNOSIS — M546 Pain in thoracic spine: Secondary | ICD-10-CM

## 2024-03-07 DIAGNOSIS — M2170 Unequal limb length (acquired), unspecified site: Secondary | ICD-10-CM

## 2024-03-07 DIAGNOSIS — M542 Cervicalgia: Secondary | ICD-10-CM

## 2024-03-07 DIAGNOSIS — M5442 Lumbago with sciatica, left side: Secondary | ICD-10-CM

## 2024-03-08 ENCOUNTER — Encounter: Payer: Self-pay | Admitting: Orthopedic Surgery
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
## 2024-09-03 ENCOUNTER — Encounter: Payer: Self-pay | Admitting: Radiology
